# Patient Record
Sex: Male | Born: 1950 | Race: White | Hispanic: No | Marital: Married | Smoking: Never smoker
Health system: Southern US, Community
[De-identification: ages and names within clinical notes are randomized; demographics above are authoritative.]

## PROBLEM LIST (undated history)

## (undated) DIAGNOSIS — I219 Acute myocardial infarction, unspecified: Secondary | ICD-10-CM

## (undated) DIAGNOSIS — R0600 Dyspnea, unspecified: Secondary | ICD-10-CM

## (undated) DIAGNOSIS — I2510 Atherosclerotic heart disease of native coronary artery without angina pectoris: Secondary | ICD-10-CM

## (undated) DIAGNOSIS — G4730 Sleep apnea, unspecified: Secondary | ICD-10-CM

## (undated) DIAGNOSIS — I10 Essential (primary) hypertension: Secondary | ICD-10-CM

## (undated) HISTORY — PX: CARDIAC SURGERY: SHX584

## (undated) HISTORY — PX: TONSILLECTOMY: SUR1361

---

## 2006-08-27 ENCOUNTER — Ambulatory Visit: Payer: Self-pay | Admitting: Emergency Medicine

## 2006-12-17 ENCOUNTER — Ambulatory Visit: Payer: Self-pay | Admitting: Internal Medicine

## 2008-06-22 ENCOUNTER — Inpatient Hospital Stay: Payer: Self-pay | Admitting: Internal Medicine

## 2008-11-04 ENCOUNTER — Other Ambulatory Visit: Payer: Self-pay | Admitting: Internal Medicine

## 2010-02-11 ENCOUNTER — Ambulatory Visit: Payer: Self-pay | Admitting: Internal Medicine

## 2011-01-22 ENCOUNTER — Ambulatory Visit: Payer: Self-pay | Admitting: Internal Medicine

## 2011-08-19 DIAGNOSIS — I10 Essential (primary) hypertension: Secondary | ICD-10-CM

## 2011-08-19 DIAGNOSIS — E782 Mixed hyperlipidemia: Secondary | ICD-10-CM | POA: Insufficient documentation

## 2011-09-15 ENCOUNTER — Emergency Department: Payer: Self-pay | Admitting: Emergency Medicine

## 2012-01-19 ENCOUNTER — Ambulatory Visit: Payer: Self-pay | Admitting: Gastroenterology

## 2012-09-06 LAB — COMPREHENSIVE METABOLIC PANEL
Albumin: 3.4 g/dL (ref 3.4–5.0)
Alkaline Phosphatase: 82 U/L (ref 50–136)
BUN: 25 mg/dL — ABNORMAL HIGH (ref 7–18)
Bilirubin,Total: 0.3 mg/dL (ref 0.2–1.0)
Chloride: 104 mmol/L (ref 98–107)
EGFR (Non-African Amer.): >60
Glucose: 95 mg/dL (ref 65–99)
Osmolality: 278 (ref 275–301)
SGOT(AST): 28 U/L (ref 15–37)
SGPT (ALT): 38 U/L (ref 12–78)
Sodium: 137 mmol/L (ref 136–145)
Total Protein: 6.6 g/dL (ref 6.4–8.2)

## 2012-09-06 LAB — CBC
MCH: 31.9 pg (ref 26.0–34.0)
MCHC: 34.4 g/dL (ref 32.0–36.0)
MCV: 93 fL (ref 80–100)
RBC: 4.8 10*6/uL (ref 4.40–5.90)
RDW: 13.4 % (ref 11.5–14.5)

## 2012-09-07 ENCOUNTER — Inpatient Hospital Stay: Payer: Self-pay | Admitting: Internal Medicine

## 2012-09-08 LAB — BASIC METABOLIC PANEL
Anion Gap: 5 — ABNORMAL LOW (ref 7–16)
BUN: 15 mg/dL (ref 7–18)
Co2: 27 mmol/L (ref 21–32)
EGFR (African American): >60
EGFR (Non-African Amer.): >60
Glucose: 82 mg/dL (ref 65–99)
Potassium: 4.2 mmol/L (ref 3.5–5.1)
Sodium: 136 mmol/L (ref 136–145)

## 2012-09-08 LAB — CBC WITH DIFFERENTIAL/PLATELET
Basophil %: 0.6 %
Eosinophil %: 3.1 %
HCT: 42.1 % (ref 40.0–52.0)
Lymphocyte #: 1.6 10*3/uL (ref 1.0–3.6)
MCH: 32 pg (ref 26.0–34.0)
Monocyte #: 0.8 x10 3/mm (ref 0.2–1.0)
Monocyte %: 11.3 %
Neutrophil #: 4.4 10*3/uL (ref 1.4–6.5)
Neutrophil %: 62.6 %
WBC: 7.1 10*3/uL (ref 3.8–10.6)

## 2012-09-11 LAB — CULTURE, BLOOD (SINGLE)

## 2014-02-08 DIAGNOSIS — Z0189 Encounter for other specified special examinations: Secondary | ICD-10-CM | POA: Insufficient documentation

## 2014-02-08 DIAGNOSIS — IMO0001 Reserved for inherently not codable concepts without codable children: Secondary | ICD-10-CM | POA: Insufficient documentation

## 2014-05-03 NOTE — H&P (Signed)
PATIENT NAME:  Curtis Clay, Curtis Clay MR#:  [id number] DATE OF BIRTH:  [birth year]  DATE OF ADMISSION:  09/07/2012  REFERRING PHYSICIAN:  Dr. Suella BroadLinda Taylor.  PRIMARY CARE PHYSICIAN:  [facility].  PRIMARY CARDIOLOGIST:  Dr. Juliann Paresallwood.   CHIEF COMPLAINT:  Scrotal swelling and erythema.   HISTORY OF PRESENT ILLNESS:  This is a 64 year old male with significant past medical history of coronary artery disease, hypertension, hyperlipidemia who presents with above-mentioned complaints, the patient reports his complaints has been going on for a few days where he started to develop some scrotal itching and erythema and tenderness where he saw his primary care physician who prescribed him some doxycycline without much improvement, as well thereafter they did start him on some antifungal cream without much improvement which prompted to come to the ED, in the ED the patient had both CT pelvis and a scrotal ultrasound which did not show any evidence of testicular torsion or abscess, given the fact the patient has been few days on by mouth antibiotics and antifungal treatment, hospitalist service were requested to admit for IV antibiotic administration, the patient denies any fever, any chills, did not have any significant leukocytosis, the patient received IV vancomycin and Zosyn in ED after his blood cultures were done, the patient reports he had a scrotal swab done by his primary care physician with results are supposed to be tomorrow.   PAST MEDICAL HISTORY: 1.  Coronary artery disease with MI, stent placement x 3 in July 2010.  2.  Hypertension.  3.  Hyperlipidemia.  4.  DM  PAST SURGICAL HISTORY:  1.  Cardiac stent in 2010.  2.  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   SOCIAL HISTORY:  The patient lives at home.  He works in [employer] in [place].  He denies any smoking, any alcohol, any illicit drug use.   FAMILY HISTORY:  No family history of early coronary  artery disease.   ALLERGIES:  No known drug allergies.   HOME MEDICATIONS: 1.  Acetaminophen/hydrocodone 5/325 mg 1 tablet every six hours as needed.  2.  Aspirin 81 mg oral daily.  3.  Enalapril now up to 10 mg oral daily.  4.  Isosorbide mononitrate 60 mg oral daily.  5.  Doxycycline 100 mg oral 2 times a day.  6.  Simvastatin 40 mg oral daily.  7.  Plavix 75 mg oral daily.  8.  Metoprolol titrate 25 mg oral daily.  9.  Cerovele 5% topical cream apply to affected area at bedtime.  10.  Betamethasone/clotrimazole topical ointment apply to affected area 2 times a day.   REVIEW OF SYSTEMS:  CONSTITUTIONAL:  The patient denies fever, chills, fatigue, weakness, weight gain, weight loss.  EYES:  Denies blurry vision, double vision, pain, inflammation, glaucoma.  EARS, NOSE, THROAT:  Denies tinnitus, ear pain, hearing loss, epistaxis or discharge.  RESPIRATORY:  Denies cough, wheezing, hemoptysis, painful respiratory or COPD.  CARDIOVASCULAR:  Denies chest pain, orthopnea, edema, arrhythmia, palpitations, syncope.  GASTROINTESTINAL:  Denies nausea, vomiting, diarrhea, abdominal pain, hematemesis, jaundice, rectal bleed.  GENITOURINARY:  Denies dysuria, hematuria, renal colic.  ENDOCRINE:  Denies polyuria, polydipsia, heat or cold intolerance.  HEMATOLOGIC:  Denies anemia, easy bruising, bleeding diathesis.  INTEGUMENTARY:  Denies any acne, has worsening scrotal erythema and tenderness and itching over the last week, has recent resection of squamous cell cancer from the back.  MUSCULOSKELETAL:  Denies swelling, gout, arthritis or cramps.  NEUROLOGIC:  Denies CVA, TIA,  seizures, dementia, ataxia, vertigo.  PSYCHIATRIC:  Denies anxiety, insomnia, bipolar disorder or schizophrenia.   PHYSICAL EXAMINATION: VITAL SIGNS:  Temperature 98.6, pulse 61, respiratory rate 20, blood pressure 154/75, saturating 96% on room air.  GENERAL:  Elderly male who looks comfortable in bed in no apparent  distress.  HEENT:  Head atraumatic, normocephalic.  Pupils equal, reactive to light.  Pink conjunctivae.  Anicteric sclerae.  Moist oral mucosa.  NECK:  Supple.  No thyromegaly.  No JVD.  CHEST:  Good air entry bilaterally.  No wheezing, rales, rhonchi.  CARDIOVASCULAR:  S1, S2 heard.  No rubs, murmurs or gallops.  ABDOMEN:  Soft, nontender, nondistended.  Bowel sounds present.  EXTREMITIES:  No edema.  No clubbing.  No cyanosis.  PSYCHIATRIC:  Appropriate affect.  Awake, alert x 3.  Intact judgment and insight.  NEUROLOGIC:  Cranial nerves grossly intact.  Motor 5 out of 5.  No focal deficits.  GENITAL:  The patient has scrotal erythema with tenderness and with some skin denudation bilaterally.  LABORATORY DATA:  Glucose 95, BUN 25, creatinine 1.24, sodium 137, potassium 4.1, chloride 104, CO2 27, ALT 38, AST 28, alkaline phosphatase 82.  White blood cell 8.4, hemoglobin 15.3, hematocrit 44.3, platelets 140.   IMAGING STUDIES:  CT pelvis with contrast showing no soft tissue emphysema or fluid collection in the region of the pelvis.  Ultrasound testicles showing no active testicular torsion.  No testicular abscess.   ASSESSMENT AND PLAN: 1.  Testicular cellulitis, the patient has been complaining of these symptoms for the last week, did not improve on by mouth antibiotics and topical antifungal infection, so he will be admitted for IV antibiotic administration, the patient appears to be having fungal components as well, so he will be started on IV Unasyn, as well he will be started on IV Diflucan as well with nystatin powder.  We will consult urology service.  Blood cultures were already sent.  A swab was done at his primary care office pending the results.  2.  Coronary artery disease.  Denies any chest pain, any shortness of breath.  We will continue him on his home meds Plavix, aspirin, statin, lisinopril and beta-blockers.  3.  Hypertension:  Blood pressure acceptable.  We will continue home  meds.  4.  Hyperlipidemia.  Continue with statin.  5.  DVT prophylaxis.  SubQ heparin.  6.  CODE STATUS:  THE PATIENT IS FULL CODE.   Total time spent on admission and patient care 50 minutes.    ____________________________ Starleen Arms, MD dse:ea D: 09/07/2012 01:16:56 ET T: 09/07/2012 02:31:00 ET JOB#: [id number]  cc: Starleen Arms, MD, <Dictator> Twylla Arceneaux Teena Irani MD ELECTRONICALLY SIGNED 09/07/2012 5:40

## 2014-05-03 NOTE — Discharge Summary (Signed)
PATIENT NAME:  Curtis Clay, Curtis Clay MR#:  [id number] DATE OF BIRTH:  November 17, 1950  DATE OF ADMISSION:  09/07/2012 DATE OF DISCHARGE:  09/08/2012  PRIMARY CARE PHYSICIAN:  Dione HousekeeperMario Ernesto Olmedo, MD  DISCHARGE DIAGNOSES: Scrotal cellulitis, dehydration, hypertension, hyperlipidemia, coronary artery disease.   CONSULTATIONS: ID, Dr. Sampson GoonFitzgerald. Dr. Evelene CroonWolff.   CONDITION: Stable.   CODE STATUS: Full code.   HOME MEDICATIONS:  1.  Enalapril 10 mg p.o. daily. 2.  Aspirin 81 mg p.o. daily. 3.  Plavix 75 mg p.o. daily. 4.  Imdur 60 mg p.o. daily.  5.  Cerovel 40% topical cream, apply topically to affected area once a day. 6.  Lopressor 25 mg p.o. daily. 7.  Zocor 40 mg p.o. daily. 8.  Betamethasone/clotrimazole topical 0.05% to 1% topical cream, apply topically to affected area b.i.d. p.r.n. for dryness.  9.  Nystatin 100,000 units/g topical powder, apply topically to affected area 3 times a day for 10 days.  10.  Augmentin 875mg /125 mg p.o. tablets q.12 hours for 10 days.   DIET: Low-sodium, low-fat, low-cholesterol diet.   ACTIVITY: As tolerated.   FOLLOWUP CARE: Follow up with PCP within 1 to 2 weeks. Follow up with Dr. Evelene CroonWolff within 1 to 2 weeks.   REASON FOR ADMISSION: Swelling and erythema.   HOSPITAL COURSE: The patient is a 64 year old Caucasian male with a history of CAD, hypertension, hyperlipidemia, who presented to the ED with scrotal swelling and erythema. The patient took p.o. antibiotics/doxycycline for 2 days without improvement, so he came to the ED for further evaluation. The patient cannot tolerate CAT scan and ultrasound did not show any evidence of testicular torsion or abscess. The patient was treated with vancomycin and Zosyn and admitted to medical floor. For detailed history and physical examination, please refer to the admission note dictated by Dr. Randol KernElgergawy. On admission date, the patient'Clay WBC was 8.4, hemoglobin 15.3. BUN 25, creatinine 1.24, electrolytes normal. After  admission, the patient has been treated with Unasyn and Diflucan with nystatin powder. Dr. Evelene CroonWolff, urologist, evaluated the patient and suggested the patient has dermatitis. Dr. Sampson GoonFitzgerald, ID physician, evaluated the patient and suggested the patient has scrotal cellulitis, added IV Unasyn overnight and then changed to p.o. Augmentin for 10 days. The patient'Clay scrotal swelling has much improved, but still has erythema Dr. Evelene CroonWolff evaluated the patient and suggested the patient has scrotal dermatitis. The patient'Clay symptoms have much improved. He is clinically stable and will be discharged to home today. I discussed the patient'Clay discharge plan with the patient, case manager and nurse.   TIME SPENT: About 33 minutes.   ____________________________ Shaune PollackQing Tarez Bowns, MD qc:jm D: 09/08/2012 14:52:00 ET T: 09/08/2012 15:49:20 ET JOB#: [id number]  cc: Shaune PollackQing Karne Ozga, MD, <Dictator> Shaune PollackQING Loleta Frommelt MD ELECTRONICALLY SIGNED 09/09/2012 14:59

## 2014-05-03 NOTE — Consult Note (Signed)
PATIENT NAME:  Curtis Clay, Curtis Clay MR#:  [id number] DATE OF BIRTH:  [birth year]  DATE OF CONSULTATION:  09/07/2012  REFERRING PHYSICIAN:  Dr. Randol KernElgergawy.  CONSULTING PHYSICIAN:  Suszanne ConnersMichael R. Evelene CroonWolff, MD  REASON FOR CONSULTATION: Scrotal erythema.  HISTORY OF PRESENT ILLNESS: Curtis Clay is a 64 year old Caucasian male who was admitted to the hospital earlier today with a 4 to 5 day history of scrotal itching and erythema. He apparently saw a nurse practitioner who prescribed some type of topical antifungal medication. This medication appeared to cause more discomfort. He also had a wound culture, which apparently has grown out Klebsiella. He also had pelvic CT and ultrasound, which indicated no evidence of gas-forming bacterial infection or epididymal and testicular infection. The patient has had no prior similar episodes. Since hospitalization, the patient states that his symptoms have actually have improved, and the swelling in his scrotal skin has improved. He received IV vancomycin and Zosyn while hospitalized.   PAST MEDICAL HISTORY:  No drug allergies.   CHRONIC MEDICATIONS:  Include Percocet, aspirin, enalapril, isosorbide mononitrate, doxycycline, simvastatin, Plavix, metoprolol, Cerovel topical cream, betamethasone, clotrimazole ointment.   PAST AND CURRENT MEDICAL CONDITIONS:  1.  Coronary artery disease, status post MI and stent placement.  2.  Hypertension.  3.  Hyperlipidemia.  4.  Diabetes.   PREVIOUS SURGICAL PROCEDURES: Include coronary artery stent in 2010 and removal of squamous cell carcinoma of his back.     SOCIAL HISTORY: The patient denied tobacco or alcohol use.   FAMILY HISTORY: Remarkable for coronary artery disease.   REVIEW OF SYSTEMS: The patient denied dysuria or hematuria, urethral discharge, fever and chills.   PHYSICAL EXAMINATION: GENITOURINARY: Circumcised. The patient had erythematous patches on the anterior scrotal surface bilaterally, larger on the right than the  left. He had no crepitus or fluctuance. Testes were both smooth, nontender, 20 cubic centimeters in size each. Spermatic cords were both normal.   LABORATORY AND RADIOLOGIC STUDIES: Included a wound culture performed on August 25th, which indicated klebsiella sensitive to amikacin, Cipro, gentamicin, tobramycin and Septra. The organism was resistant to ampicillin.   Scrotal ultrasound dated August 27th was normal.   Abdominopelvic CT scan dated August 27th was also normal.   Pertinent laboratory studies include a white cell count of 8400 with hematocrit of 44%, and BUN of 25 with a creatinine of 1.24.   IMPRESSION: Scrotal dermatitis, possibly infectious versus allergic reaction to topical creams.   SUGGESTIONS: 1.  Scrotal support and daily bathing with warm, soapy water.  2.  Avoid topicals.  3.  Treat the culture with appropriate antibiotics.  4.  Follow up in the office after discharge.  5.  The patient is stable to be discharged from urologic standpoint. It does not appear that he has Fournier'Clay gangrene or epididymitis or orchitis at this time.         ____________________________ Suszanne ConnersMichael R. Evelene CroonWolff, MD mrw:dmm D: 09/07/2012 12:30:00 ET T: 09/07/2012 12:43:19 ET JOB#: [id number]  cc: Suszanne ConnersMichael R. Evelene CroonWolff, MD, <Dictator> Orson ApeMICHAEL R WOLFF MD ELECTRONICALLY SIGNED 09/07/2012 14:01

## 2014-05-03 NOTE — Consult Note (Signed)
Brief Consult Note: Diagnosis: Scrotal cellulitis- mild. No evidence of abscess, fourniers.  No fevers. Nml WBC. No DM.   Patient was seen by consultant.   Consult note dictated.   Recommend further assessment or treatment.   Orders entered.   Comments: Rec cont unasyn overnight If continues to improved would dc on augmentin 875 bid for 10 days with otpt fu to ensure clearance. Thank you for consult. Will follow with you.  Electronic Signatures: Dierdre HarnessFitzgerald, Elizeth Weinrich Patrick (MD)  (Signed 28-Aug-14 13:05)  Authored: Brief Consult Note   Last Updated: 28-Aug-14 13:05 by Dierdre HarnessFitzgerald, Aerie Donica Patrick (MD)

## 2014-05-03 NOTE — Consult Note (Signed)
Brief Consult Note: Diagnosis: Scrotal dermatitis.   Patient was seen by consultant.   Consult note dictated.   Recommend further assessment or treatment.   Orders entered.   Discussed with Attending MD.   Comments: Skin culture 8/25 grew out Klebsiella. Treat with appropriate ATBs as per sensitivity. Scrotal support. Shower/clean daily with warm/soapy water and dry thoroughly. No testicular/epididymal infection or Fournier's is present. Appears to be improving on antibiotics..  Electronic Signatures: Orson ApeWolff, Kaseem Vastine R (MD)  (Signed 28-Aug-14 12:44)  Authored: Brief Consult Note   Last Updated: 28-Aug-14 12:44 by Orson ApeWolff, Shaylah Mcghie R (MD)

## 2014-05-03 NOTE — Consult Note (Signed)
PATIENT NAME:  Curtis Clay, Curtis Clay MR#:  [id number] DATE OF BIRTH:  22-Jul-1950  DATE OF CONSULTATION:  09/07/2012  REFERRING PHYSICIAN: Dr. Evelene Croon.   CONSULTING PHYSICIAN:  Stann Mainland. Sampson Goon, MD  HISTORY OF PRESENT ILLNESS: This is a very pleasant 64 year old gentleman with a history of coronary artery disease, hypertension and hyperlipidemia, who presented to the ED yesterday with scrotal swelling and pain. He says his symptoms began about 1 week ago when he was the dermatologist and noted some itching and redness in his scrotum. The dermatologist told him it was likely jock itch and gave him 2 different types of creams, the names of which he does not remember. However, over the weekend, the redness and pain started to increase and he went to his primary care doctor, who gave him doxycycline without much improvement. When he returned to his primary care doctor yesterday they referred him to the ED for IV antibiotics. He says that the pain and swelling had continued, especially in his right testicle. He has had no fevers, chills or night sweats. He does not have diabetes.   In the ED, he was given a dose of vancomycin and Zosyn. He has since had some improvement overnight as he has been getting Unasyn IV. He says the swelling and redness is down somewhat. He has been seen by Dr. Evelene Croon, who does not feel it is likely a case of Fournier's and is not concerned for any degree of an abscess.   PAST MEDICAL HISTORY:  1.  Coronary artery disease with stenting in 2010.  2.  Hypertension.  3.  Hyperlipidemia.  4.  Diet controlled diabetes.   PAST SURGICAL HISTORY:  1.  Stenting.  2.  Recent squamous cell carcinoma excision from his back.   SOCIAL HISTORY: The patient lives at home. He works in [employer] in [place]. He does not smoke or drink. He has no alcohol or illicit drug use.   FAMILY HISTORY: Negative.   ALLERGIES: No known drug allergies.   ANTIBIOTICS SINCE ADMISSION INCLUDE:   1.  Vancomycin. He received 1 dose yesterday.  2.  Zosyn. He received 1 dose yesterday.  3.  Unasyn. He is currently on this and has received it overnight.   REVIEW OF SYSTEMS: 11 systems reviewed and negative except as per history of present illness.   PHYSICAL EXAMINATION:  VITAL SIGNS: T-max 98, pulse 60, blood pressure 147/81, pulse oximetry 95%.  GENERAL: He is pleasant, interactive, no acute distress.  HEENT: Pupils equal, round, reactive to light and accommodation. Extraocular movements are intact. Oropharynx is clear.  HEART: Regular.  LUNGS: Clear.  ABDOMEN: Soft, nontender, nondistended. No hepatosplenomegaly. GENITOURINARY: His scrotum has some anterior and distal erythema. This has been demarcated by Dr. Sheppard Penton. It is mildly tender to palpation. There is no evidence of drainage or an abscess. There is no spreading redness or streaking. He has no inguinal lymphadenopathy.   LABORATORY, DIAGNOSTIC AND RADIOLOGICAL DATA: White blood count on admission is 8.4, hemoglobin 15.3, platelets 140. BUN 25, creatinine 1.24. LFTs normal. Ultrasound of the testicle was negative for testicular torsion or abscess. CT of his pelvis revealed no soft tissue emphysema or free collection in the region of the pelvis. Culture from outpatient records done on 08/25 from the left groin reveals no organisms and no white blood cells on Gram stain but growing 1+  Klebsiella oxytoca sensitive to ampicillin, ciprofloxacin, gentamicin, tobramycin and Bactrim, but resistant to ampicillin. There is no sensitivity noted for Augmentin or  Unasyn.   IMPRESSION: A 64 year old with coronary artery disease, hypertension, admitted with increasing pain and redness in his scrotum. This likely began as a tinea cruris infection that has become superinfected. He has no evidence of abscess. He has normal white count and is not febrile. Blood cultures are pending, but are negative. Culture from the outside shows Klebsiella oxytoca.    This is likely a polymicrobial infection. I would not base treatment solely on the Klebsiella oxytoca. He clinically is responding, although he had received both vancomycin, Zosyn and Unasyn since admission. At this point, I would recommend continuing him on Unasyn overnight. If this continues to improve, I would recommend we could discharge him on Augmentin 875 twice a day as an outpatient for 10 days with appropriate followup.   Thank you for the consult. I will be glad to follow with you.  ____________________________ Stann Mainlandavid P. Sampson GoonFitzgerald, MD dpf:aw D: 09/07/2012 13:04:26 ET T: 09/07/2012 13:17:36 ET JOB#: [id number]  cc: Stann Mainlandavid P. Sampson GoonFitzgerald, MD, <Dictator> Billy Turvey Sampson GoonFITZGERALD MD ELECTRONICALLY SIGNED 09/11/2012 11:51

## 2014-11-12 IMAGING — CT CT PELVIS W/ CM
1 series · 16 of 32 positions shown, 20 images · non-contrast
Comparison: None

REASON FOR EXAM: swelling to testicles, rule out Mayk Feng
gangrene;    NOTE: Nursing to G
COMMENTS:

PROCEDURE:     CT  - CT PELVIS STANDARD W  - September 06, 2012 [DATE]
RESULT:     History: Soft tissue swelling in the peroneal region
TECHNIQUE: Multiple axial images obtained of the pelvis with sagittal and
coronal reformatted images provided following 100 mL of Vsovue-DH4
intravenous contrast.

[Series 2: pelvis · axial · 0.78mm/px · z∈[+62,+374]mm · 16 of 116 slices shown, 20 images]
[im 8/116  soft-tissue]
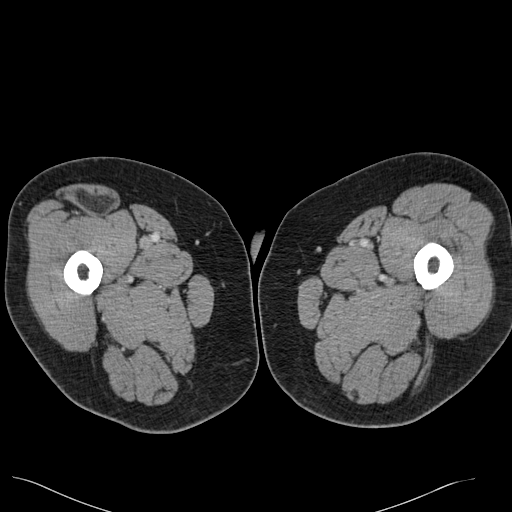
[im 8/116  bone]
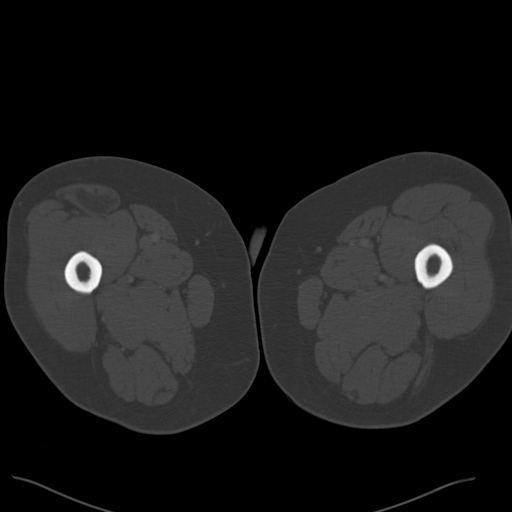
[im 15/116  soft-tissue]
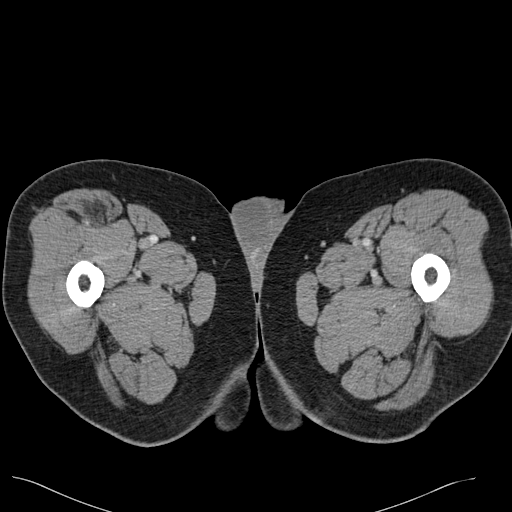
[im 23/116  soft-tissue]
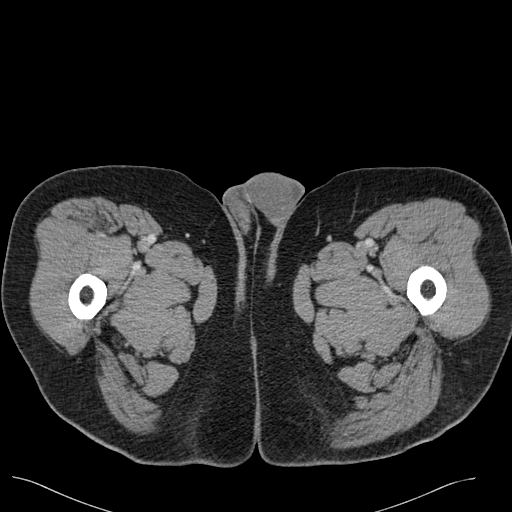
[im 30/116  soft-tissue]
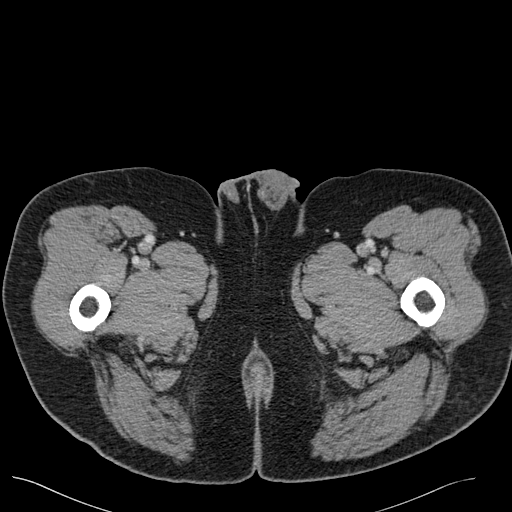
[im 38/116  soft-tissue]
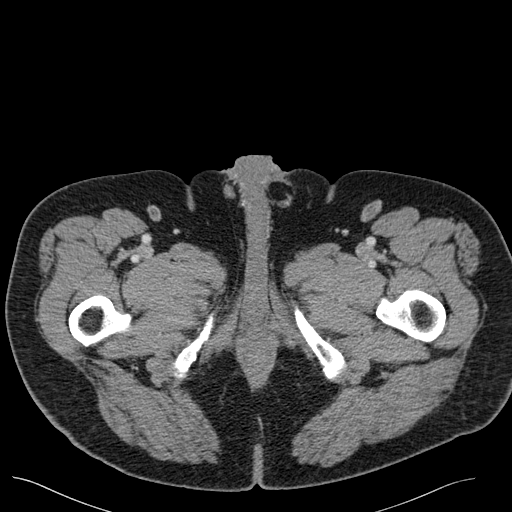
[im 45/116  soft-tissue]
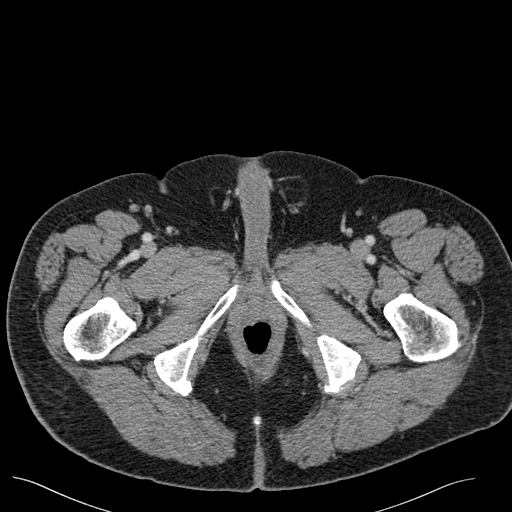
[im 52/116  soft-tissue]
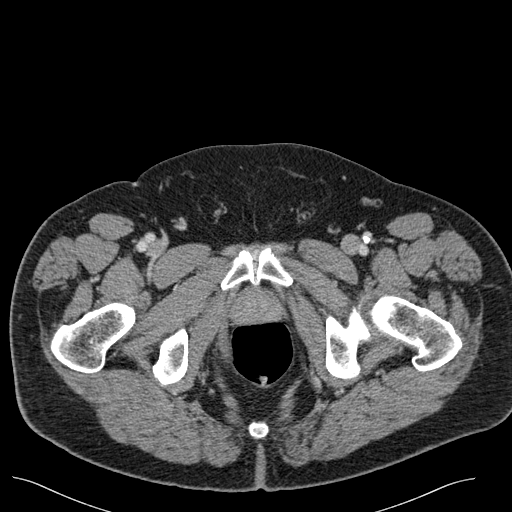
[im 64/116  soft-tissue]
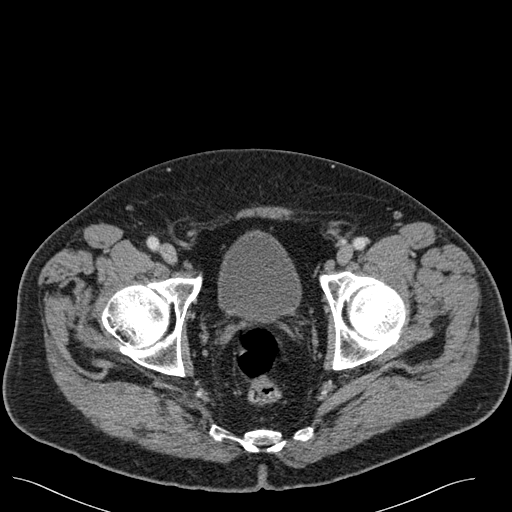
[im 71/116  soft-tissue]
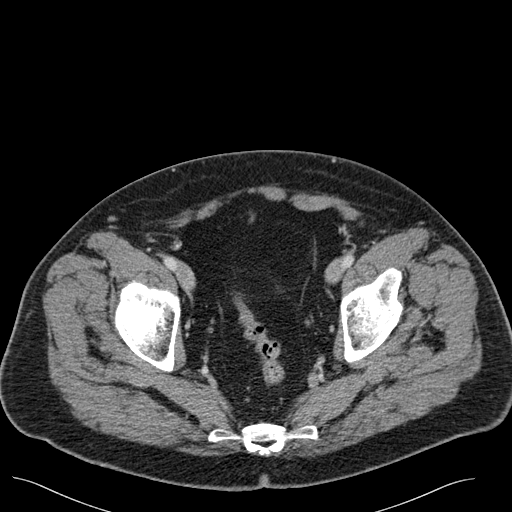
[im 71/116  bone]
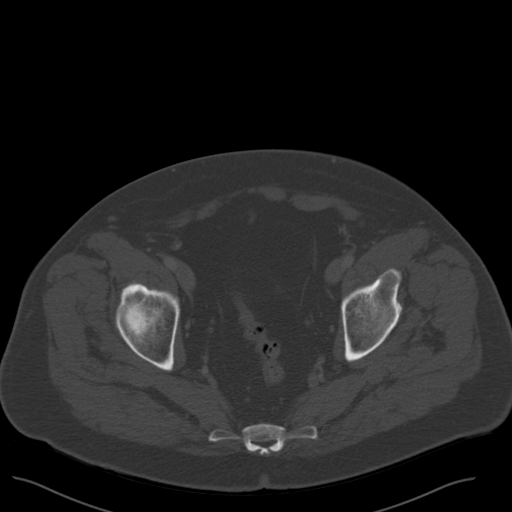
[im 78/116  soft-tissue]
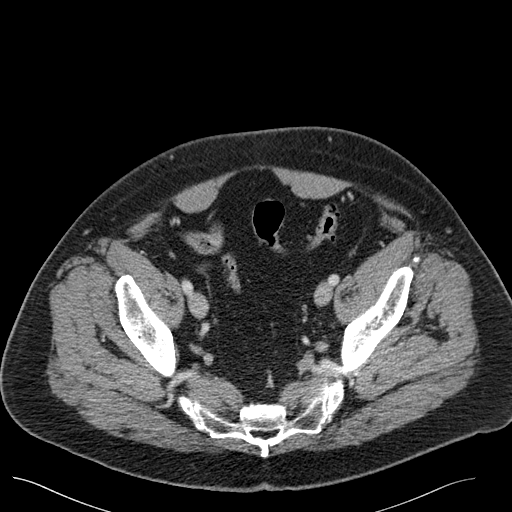
[im 86/116  soft-tissue]
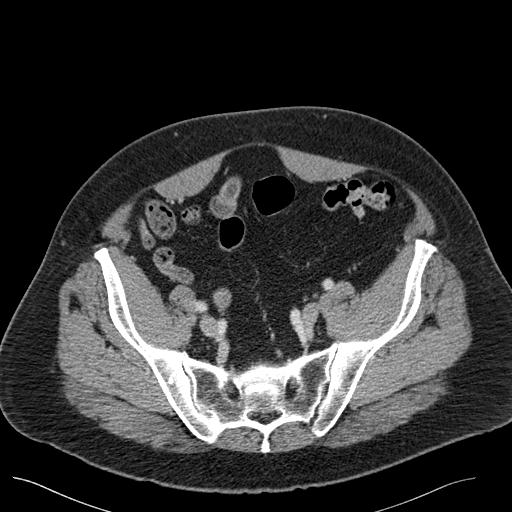
[im 93/116  soft-tissue]
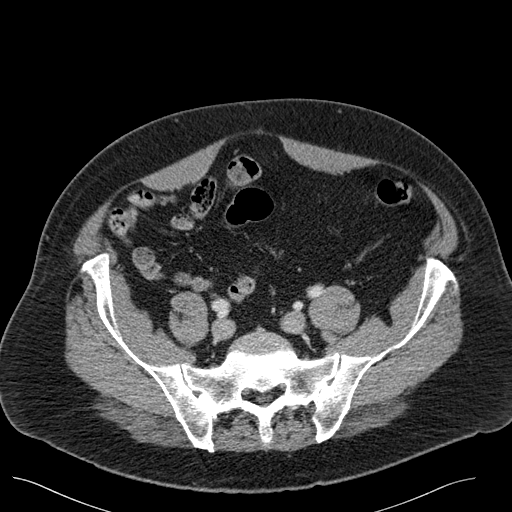
[im 101/116  soft-tissue]
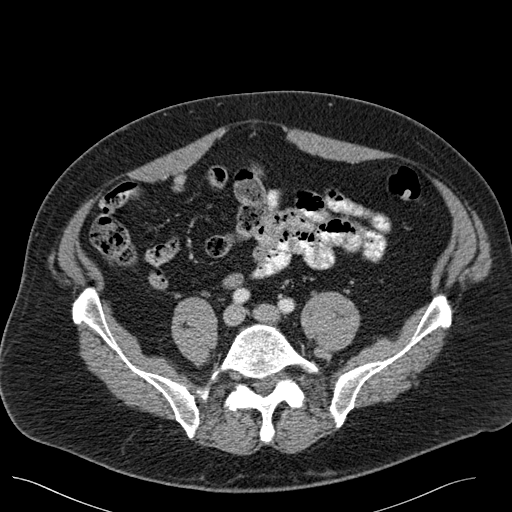
[im 101/116  lung]
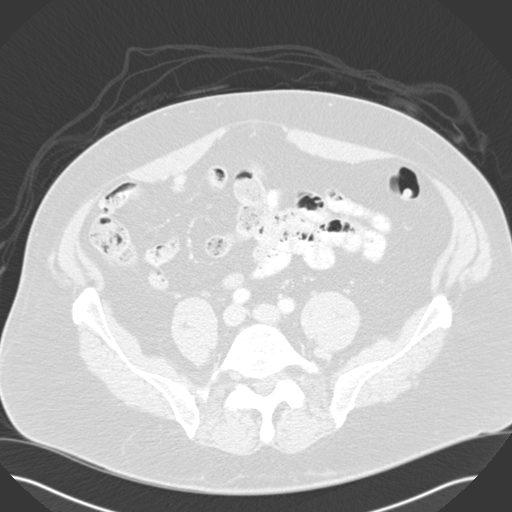
[im 104/116  lung]
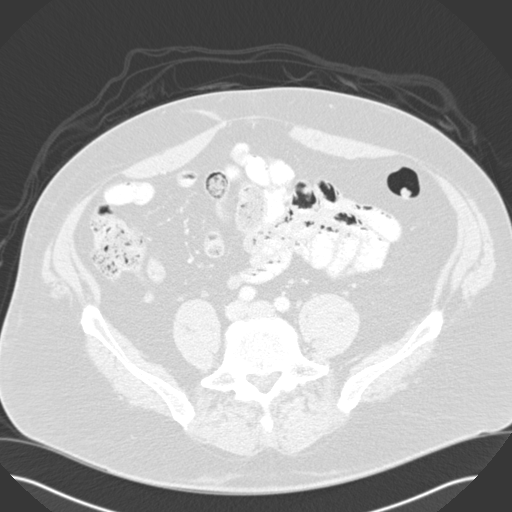
[im 108/116  soft-tissue]
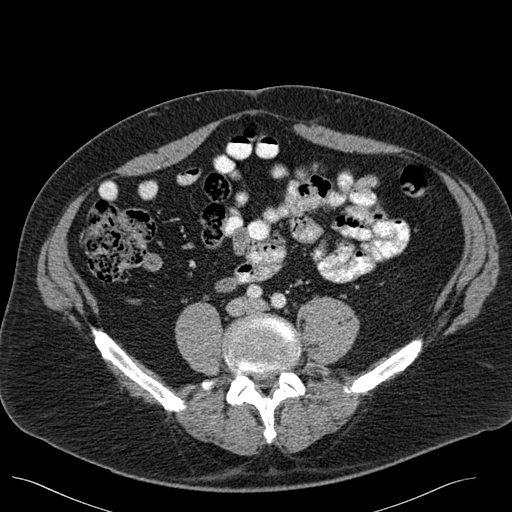
[im 108/116  lung]
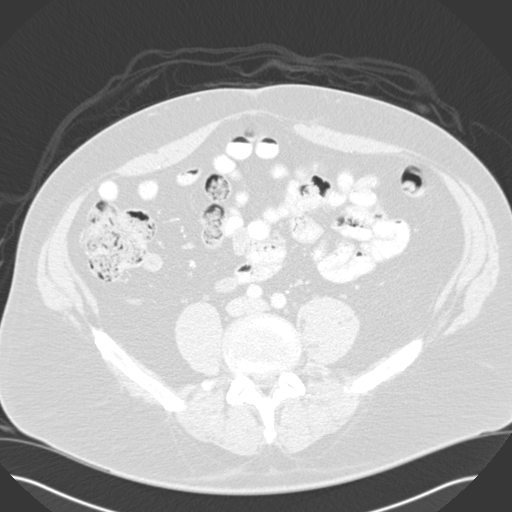
[im 112/116  lung]
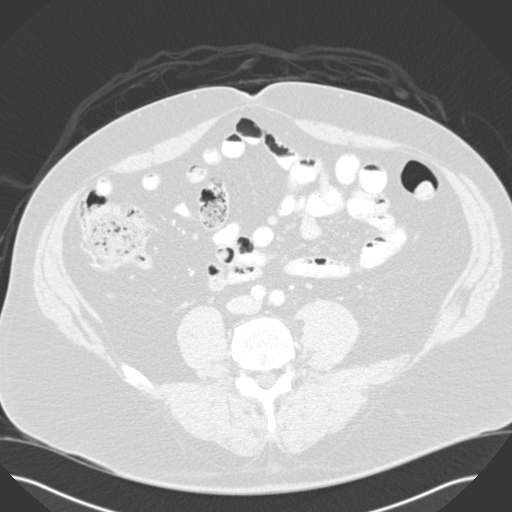

[16 of 32 positions shown; findings below may reference images not displayed]

FINDINGS: There is no soft tissue emphysema. There is no soft tissue fluid collection.

There is no pelvic free fluid. The visualized bowel is nondistended without
bowel wall thickening.

The osseous structures are unremarkable.

There is fatty atrophy of the right rectus femoris muscle.
IMPRESSION: No soft tissue emphysema or fluid collection in the region of the pelvis.

## 2016-07-28 ENCOUNTER — Ambulatory Visit
Admission: EM | Admit: 2016-07-28 | Discharge: 2016-07-28 | Disposition: A | Discharge status: Home / Self Care | Payer: Medicare Other | Attending: Family Medicine | Admitting: Family Medicine

## 2016-07-28 ENCOUNTER — Encounter: Payer: Self-pay | Admitting: Emergency Medicine

## 2016-07-28 DIAGNOSIS — M545 Low back pain, unspecified: Secondary | ICD-10-CM

## 2016-07-28 HISTORY — DX: Acute myocardial infarction, unspecified: I21.9

## 2016-07-28 HISTORY — DX: Essential (primary) hypertension: I10

## 2016-07-28 MED ORDER — CYCLOBENZAPRINE HCL 10 MG PO TABS: ORAL_TABLET | ORAL | 0 refills | Status: DC

## 2016-07-28 NOTE — ED Provider Notes (Signed)
[facility] ____________________________________________  Time seen: Approximately 11:46 AM  I have reviewed the triage vital signs and the nursing notes.   HISTORY  Chief Complaint Back Pain   HPI Curtis Clay is a 66 y.o. male presenting for evaluation of low back pain that is been present gradual in onset over the last week. Patient reports last week while he was "tinkering" around the house he felt a slight "twinge" in his low back but states it didn't really bother him. States over the next few days he noticed he had a very mild low back pain that had continued. Patient reports this morning when getting up his pain had increased. States if sitting still his pain is maybe a 1-1.5, but states when he actively moves such as sitting up for bed or twisting his back his pain jumps up to moderate level. Patient states the moderate level pain only lasts for a few seconds and then comes back down. Patient reports movement aggravates the pain. Reports diffuse sitting still minimal pain. Reports minimal pain with walking. Patient denies any fall, direct trauma, or known injury. Reports he has a history of similar back pain many years ago with muscle spasms that feels exactly the same. States the pain is a catching pain that lasts for a few seconds and then releases. States pain stays in his low back. Denies any pain radiation, paresthesias, urinary or bowel retention or incontinence, abdominal pain, chest pain, shortness of breath, dizziness, weakness, rash, skin changes, numbness, decreased range of motion. Patient has not taken any over-the-counter medications for the same complaints. Reports otherwise feels well.  Denies chest pain, shortness of breath, abdominal pain, dysuria, extremity pain, extremity swelling or rash. Denies recent sickness. Denies recent antibiotic use.   Dione Housekeeperlmedo, Mario Ernesto, MD: PCP   Past Medical History:  Diagnosis Date  . Hypertension   . Myocardial  infarction [facility](HCC)   CAD  Angina Pectoris Hyperlipidemia   There are no active problems to display for this patient.   Past Surgical History:  Procedure Laterality Date  . CARDIAC SURGERY       No current facility-administered medications for this encounter.   Current Outpatient Prescriptions:  .  aspirin EC 81 MG tablet, Take 81 mg by mouth daily., Disp: , Rfl:  .  ENALAPRIL MALEATE PO, Take 50 mg by mouth daily., Disp: , Rfl:  .  metoprolol succinate (TOPROL-XL) 25 MG 24 hr tablet, Take 25 mg by mouth daily., Disp: , Rfl:  .  simvastatin (ZOCOR) 40 MG tablet, Take 40 mg by mouth daily., Disp: , Rfl:  .  cyclobenzaprine (FLEXERIL) 10 MG tablet, Take 0.5 tablet orally during the day, one tablet at night as needed for muscle spasm. Do not drive while taking as can cause drowsiness., Disp: 20 tablet, Rfl: 0  Allergies Patient has no known allergies.  History reviewed. No pertinent family history.  Social History Social History  Substance Use Topics  . Smoking status: Never Smoker  . Smokeless tobacco: Never Used  . Alcohol use No    Review of Systems Constitutional: No fever/chills Eyes: No visual changes. Cardiovascular: Denies chest pain. Respiratory: Denies shortness of breath. Gastrointestinal: No abdominal pain.  No nausea, no vomiting.  No diarrhea.  No constipation. Genitourinary: Negative for dysuria. Musculoskeletal: Positive for back pain. Skin: Negative for rash. Neurological: Negative for headaches, focal weakness or numbness.   ____________________________________________   PHYSICAL EXAM:  VITAL SIGNS: ED Triage Vitals  Enc Vitals Group  BP 07/28/16 1129 (!) 142/66     Pulse Rate 07/28/16 1129 60     Resp 07/28/16 1129 16     Temp 07/28/16 1129 98.3 F (36.8 C)     Temp Source 07/28/16 1129 Oral     SpO2 07/28/16 1129 98 %     Weight 07/28/16 1125 249 lb (112.9 kg)     Height 07/28/16 1125 5' 9.5" (1.765 m)     Head Circumference --       Peak Flow --      Pain Score 07/28/16 1125 7     Pain Loc --      Pain Edu? --      Excl. in GC? --     Constitutional: Alert and oriented. Well appearing and in no acute distress. ENT      Head: Normocephalic and atraumatic.      Mouth/Throat: Mucous membranes are moist. Cardiovascular: Normal rate, regular rhythm. Grossly normal heart sounds.  Good peripheral circulation. Respiratory: Normal respiratory effort without tachypnea nor retractions. Breath sounds are clear and equal bilaterally. No wheezes, rales, rhonchi. Gastrointestinal: Soft and nontender. Obese abdomen. No CVA tenderness. Musculoskeletal:  Nontender with normal range of motion in all extremities. No midline cervical or thoracic tenderness to palpation. Bilateral pedal pulses equal and easily palpated.      Right lower leg:  No tenderness or edema.      Left lower leg:  No tenderness or edema.  Except: Minimal to mild diffuse low back pain along lower midline lumbar and paralumbar musculature, no distinct midline tenderness to palpation, no erythema, no ecchymosis, no skin changes noted, changes positions quickly, ambulatory with steady gait, no pain with bilateral standing knee lifts, bilateral plantar flexion and dorsiflexion strong and equal, no saddle anesthesia, bilateral lower extremities nontender, pain increases with right and left lumbar rotation as well as lumbar flexion and extension, but full range of motion present.  Neurologic:  Normal speech and language. No gross focal neurologic deficits are appreciated. Speech is normal. No gait instability.  Skin:  Skin is warm, dry and intact. No rash noted. Psychiatric: Mood and affect are normal. Speech and behavior are normal. Patient exhibits appropriate insight and judgment   ___________________________________________   LABS (all labs ordered are listed, but only abnormal results are displayed)  Labs Reviewed - No data to display   PROCEDURES Procedures     INITIAL IMPRESSION / ASSESSMENT AND PLAN / ED COURSE  Pertinent labs & imaging results that were available during my care of the patient were reviewed by me and considered in my medical decision making (see chart for details).  Very well-appearing patient. No acute distress. Presents for evaluation of low back pain is primarily with movement. Denies fall or direct trauma. Reports history of similar pain in the past with muscle strain injury. No focal neurological deficits. No point bony tenderness. Discussed patient with patient evaluation of x-ray at this time versus conservative management with medications, patient declines imaging at this time. Will treat patient with Flexeril as needed, half tablet during the day and one tablet at night. As patient does have extensive cardiac history, also discussed patient to take over-the-counter Tylenol as needed. Encourage ice heat rotation, stretching and points of strenuous activity. Discussed follow up with PCP or orthopedics in 1 week as needed for continued pain. Discussed return sooner for any worsening complaints, fever, urinary or bowel changes, difficulty ambulating, new or worsening concerns.Discussed indication, risks and benefits of medications with patient.  Discussed follow up with Primary care physician this week. Discussed follow up and return parameters including no resolution or any worsening concerns. Patient verbalized understanding and agreed to plan.   ____________________________________________   FINAL CLINICAL IMPRESSION(S) / ED DIAGNOSES  Final diagnoses:  Acute bilateral low back pain without sciatica     Discharge Medication List as of 07/28/2016 12:20 PM    START taking these medications   Details  cyclobenzaprine (FLEXERIL) 10 MG tablet Take 0.5 tablet orally during the day, one tablet at night as needed for muscle spasm. Do not drive while taking as can cause drowsiness., Normal        Note: This dictation was  prepared with Dragon dictation along with smaller phrase technology. Any transcriptional errors that result from this process are unintentional.         Renford Dills, NP 07/28/16 1248

## 2016-07-28 NOTE — Discharge Instructions (Signed)
Take medication as prescribed. Rest. Drink plenty of fluids. Stretch. Alternate heat and ice. Take over the counter tylenol as needed.   Follow up with your primary care physician this week as needed. Return to Urgent care for new or worsening concerns.

## 2016-07-28 NOTE — ED Triage Notes (Signed)
Patient c/o lower back pain for a week.  Patient reports increase in pain when he bends a certain way.

## 2016-08-20 ENCOUNTER — Inpatient Hospital Stay
Admit: 2016-08-20 | Discharge: 2016-08-20 | Disposition: A | Discharge status: Home / Self Care | Payer: Medicare Other | Attending: Internal Medicine | Admitting: Internal Medicine

## 2016-08-20 ENCOUNTER — Emergency Department: Payer: Medicare Other

## 2016-08-20 ENCOUNTER — Inpatient Hospital Stay
Admission: EM | Admit: 2016-08-20 | Discharge: 2016-08-21 | DRG: 247 | Disposition: A | Discharge status: Home / Self Care | Payer: Medicare Other | Attending: Internal Medicine | Admitting: Internal Medicine

## 2016-08-20 ENCOUNTER — Inpatient Hospital Stay: Admit: 2016-08-20 | Payer: Medicare Other

## 2016-08-20 ENCOUNTER — Other Ambulatory Visit: Payer: Self-pay

## 2016-08-20 ENCOUNTER — Encounter
Admission: EM | Disposition: A | Discharge status: Home / Self Care | Payer: Self-pay | Source: Home / Self Care | Attending: Internal Medicine

## 2016-08-20 ENCOUNTER — Encounter: Payer: Self-pay | Admitting: Emergency Medicine

## 2016-08-20 DIAGNOSIS — Z79899 Other long term (current) drug therapy: Secondary | ICD-10-CM | POA: Diagnosis not present

## 2016-08-20 DIAGNOSIS — I10 Essential (primary) hypertension: Secondary | ICD-10-CM | POA: Diagnosis not present

## 2016-08-20 DIAGNOSIS — Z9861 Coronary angioplasty status: Secondary | ICD-10-CM | POA: Diagnosis not present

## 2016-08-20 DIAGNOSIS — I25110 Atherosclerotic heart disease of native coronary artery with unstable angina pectoris: Secondary | ICD-10-CM | POA: Diagnosis present

## 2016-08-20 DIAGNOSIS — I209 Angina pectoris, unspecified: Secondary | ICD-10-CM

## 2016-08-20 DIAGNOSIS — I252 Old myocardial infarction: Secondary | ICD-10-CM | POA: Insufficient documentation

## 2016-08-20 DIAGNOSIS — I214 Non-ST elevation (NSTEMI) myocardial infarction: Secondary | ICD-10-CM | POA: Diagnosis present

## 2016-08-20 DIAGNOSIS — I2510 Atherosclerotic heart disease of native coronary artery without angina pectoris: Secondary | ICD-10-CM | POA: Diagnosis not present

## 2016-08-20 DIAGNOSIS — Z7982 Long term (current) use of aspirin: Secondary | ICD-10-CM

## 2016-08-20 HISTORY — PX: LEFT HEART CATH AND CORONARY ANGIOGRAPHY: CATH118249

## 2016-08-20 HISTORY — PX: CORONARY STENT INTERVENTION: CATH118234

## 2016-08-20 LAB — BASIC METABOLIC PANEL
ANION GAP: 7 (ref 5–15)
BUN: 14 mg/dL (ref 6–20)
CHLORIDE: 107 mmol/L (ref 101–111)
CO2: 24 mmol/L (ref 22–32)
Calcium: 9.1 mg/dL (ref 8.9–10.3)
Creatinine, Ser: 0.99 mg/dL (ref 0.61–1.24)
GFR calc Af Amer: >60 mL/min (ref >60)
GFR calc non Af Amer: >60 mL/min (ref >60)
GLUCOSE: 110 mg/dL — AB (ref 65–99)
POTASSIUM: 4.3 mmol/L (ref 3.5–5.1)
Sodium: 138 mmol/L (ref 135–145)

## 2016-08-20 LAB — CBC
HEMATOCRIT: 46.5 % (ref 40.0–52.0)
HEMOGLOBIN: 15.8 g/dL (ref 13.0–18.0)
MCH: 31.5 pg (ref 26.0–34.0)
MCHC: 34.1 g/dL (ref 32.0–36.0)
MCV: 92.3 fL (ref 80.0–100.0)
Platelets: 141 10*3/uL — ABNORMAL LOW (ref 150–440)
RBC: 5.03 MIL/uL (ref 4.40–5.90)
RDW: 13.5 % (ref 11.5–14.5)
WBC: 7.7 10*3/uL (ref 3.8–10.6)

## 2016-08-20 LAB — PROTIME-INR
INR: 1
Prothrombin Time: 13.2 seconds (ref 11.4–15.2)

## 2016-08-20 LAB — APTT: aPTT: 28 seconds (ref 24–36)

## 2016-08-20 LAB — TROPONIN I
TROPONIN I: 1.1 ng/mL — AB (ref <0.03)
Troponin I: 0.14 ng/mL (ref <0.03)
Troponin I: 0.24 ng/mL (ref <0.03)

## 2016-08-20 LAB — HEMOGLOBIN A1C
HEMOGLOBIN A1C: 5.7 % — AB (ref 4.8–5.6)
MEAN PLASMA GLUCOSE: 116.89 mg/dL

## 2016-08-20 LAB — LIPID PANEL
CHOL/HDL RATIO: 3.4 ratio
CHOLESTEROL: 122 mg/dL (ref 0–200)
HDL: 36 mg/dL — AB (ref >40)
LDL Cholesterol: 80 mg/dL (ref 0–99)
Triglycerides: 32 mg/dL (ref <150)
VLDL: 6 mg/dL (ref 0–40)

## 2016-08-20 LAB — POCT ACTIVATED CLOTTING TIME: Activated Clotting Time: 577 seconds

## 2016-08-20 LAB — GLUCOSE, CAPILLARY: GLUCOSE-CAPILLARY: 104 mg/dL — AB (ref 65–99)

## 2016-08-20 SURGERY — LEFT HEART CATH AND CORONARY ANGIOGRAPHY
Anesthesia: Moderate Sedation

## 2016-08-20 MED ORDER — DOCUSATE SODIUM 100 MG PO CAPS: 100.0000 mg | ORAL_CAPSULE | Freq: Two times a day (BID) | ORAL | Status: DC | PRN

## 2016-08-20 MED ORDER — FENTANYL CITRATE (PF) 100 MCG/2ML IJ SOLN
INTRAMUSCULAR | Status: DC | PRN
  Administered 2016-08-20: 25 ug via INTRAVENOUS

## 2016-08-20 MED ORDER — MIDAZOLAM HCL 2 MG/2ML IJ SOLN
INTRAMUSCULAR | Status: DC | PRN
  Administered 2016-08-20: 1 mg via INTRAVENOUS

## 2016-08-20 MED ORDER — ASPIRIN 81 MG PO CHEW
324.0000 mg | CHEWABLE_TABLET | Freq: Once | ORAL | Status: AC
  Administered 2016-08-20: 324 mg via ORAL
  Filled 2016-08-20: qty 4

## 2016-08-20 MED ORDER — SODIUM CHLORIDE 0.9% FLUSH: 3.0000 mL | INTRAVENOUS | Status: DC | PRN

## 2016-08-20 MED ORDER — NITROGLYCERIN 1 MG/10 ML FOR IR/CATH LAB
INTRA_ARTERIAL | Status: DC | PRN
  Administered 2016-08-20: 200 ug via INTRACORONARY

## 2016-08-20 MED ORDER — ACETAMINOPHEN 325 MG PO TABS: 650.0000 mg | ORAL_TABLET | ORAL | Status: DC | PRN

## 2016-08-20 MED ORDER — CLOPIDOGREL BISULFATE 75 MG PO TABS
ORAL_TABLET | ORAL | Status: DC | PRN
  Administered 2016-08-20: 600 mg via ORAL

## 2016-08-20 MED ORDER — SODIUM CHLORIDE 0.9 % IV SOLN
INTRAVENOUS | Status: AC | PRN
  Administered 2016-08-20: 1.75 mg/kg/h via INTRAVENOUS

## 2016-08-20 MED ORDER — SODIUM CHLORIDE 0.9% FLUSH: 3.0000 mL | Freq: Two times a day (BID) | INTRAVENOUS | Status: DC

## 2016-08-20 MED ORDER — ENALAPRIL MALEATE 5 MG PO TABS
5.0000 mg | ORAL_TABLET | Freq: Every day | ORAL | Status: DC
  Administered 2016-08-20 – 2016-08-21 (×2): 5 mg via ORAL
  Filled 2016-08-20 (×2): qty 1

## 2016-08-20 MED ORDER — LABETALOL HCL 5 MG/ML IV SOLN: 10.0000 mg | INTRAVENOUS | Status: AC | PRN

## 2016-08-20 MED ORDER — DIPHENHYDRAMINE HCL 25 MG PO CAPS
25.0000 mg | ORAL_CAPSULE | Freq: Four times a day (QID) | ORAL | Status: DC | PRN
  Administered 2016-08-20: 25 mg via ORAL
  Filled 2016-08-20 (×2): qty 1

## 2016-08-20 MED ORDER — MIDAZOLAM HCL 2 MG/2ML IJ SOLN
INTRAMUSCULAR | Status: AC
  Filled 2016-08-20: qty 2

## 2016-08-20 MED ORDER — HEPARIN (PORCINE) IN NACL 100-0.45 UNIT/ML-% IJ SOLN
1250.0000 [IU]/h | INTRAMUSCULAR | Status: DC
  Administered 2016-08-20: 1250 [IU]/h via INTRAVENOUS
  Filled 2016-08-20: qty 250

## 2016-08-20 MED ORDER — CLOPIDOGREL BISULFATE 75 MG PO TABS
75.0000 mg | ORAL_TABLET | Freq: Every day | ORAL | Status: DC
  Administered 2016-08-21: 75 mg via ORAL
  Filled 2016-08-20: qty 1

## 2016-08-20 MED ORDER — ASPIRIN 81 MG PO CHEW: 81.0000 mg | CHEWABLE_TABLET | ORAL | Status: DC

## 2016-08-20 MED ORDER — ASPIRIN 81 MG PO CHEW: 81.0000 mg | CHEWABLE_TABLET | Freq: Every day | ORAL | Status: DC

## 2016-08-20 MED ORDER — SODIUM CHLORIDE 0.9 % IV SOLN
0.2500 mg/kg/h | INTRAVENOUS | Status: DC
  Filled 2016-08-20: qty 250

## 2016-08-20 MED ORDER — IOPAMIDOL (ISOVUE-300) INJECTION 61%: INTRAVENOUS | Status: DC | PRN

## 2016-08-20 MED ORDER — HYDRALAZINE HCL 20 MG/ML IJ SOLN
INTRAMUSCULAR | Status: AC
  Filled 2016-08-20: qty 1

## 2016-08-20 MED ORDER — CLOPIDOGREL BISULFATE 300 MG PO TABS
ORAL_TABLET | ORAL | Status: AC
  Filled 2016-08-20: qty 2

## 2016-08-20 MED ORDER — SODIUM CHLORIDE 0.9 % IV SOLN: 250.0000 mL | INTRAVENOUS | Status: DC | PRN

## 2016-08-20 MED ORDER — IOPAMIDOL (ISOVUE-300) INJECTION 61%
INTRAVENOUS | Status: DC | PRN
  Administered 2016-08-20: 60 mL via INTRA_ARTERIAL

## 2016-08-20 MED ORDER — SODIUM CHLORIDE 0.9 % WEIGHT BASED INFUSION: 1.0000 mL/kg/h | INTRAVENOUS | Status: DC

## 2016-08-20 MED ORDER — NITROGLYCERIN 0.4 MG SL SUBL
0.4000 mg | SUBLINGUAL_TABLET | Freq: Once | SUBLINGUAL | Status: AC
  Administered 2016-08-20: 0.4 mg via SUBLINGUAL
  Filled 2016-08-20: qty 1

## 2016-08-20 MED ORDER — ATORVASTATIN CALCIUM 10 MG PO TABS
10.0000 mg | ORAL_TABLET | Freq: Every day | ORAL | Status: DC
  Administered 2016-08-20: 10 mg via ORAL
  Filled 2016-08-20: qty 1

## 2016-08-20 MED ORDER — ONDANSETRON HCL 4 MG/2ML IJ SOLN: 4.0000 mg | Freq: Four times a day (QID) | INTRAMUSCULAR | Status: DC | PRN

## 2016-08-20 MED ORDER — SODIUM CHLORIDE 0.9 % WEIGHT BASED INFUSION: 1.0000 mL/kg/h | INTRAVENOUS | Status: AC

## 2016-08-20 MED ORDER — SODIUM CHLORIDE 0.9 % WEIGHT BASED INFUSION: 3.0000 mL/kg/h | INTRAVENOUS | Status: DC

## 2016-08-20 MED ORDER — BIVALIRUDIN TRIFLUOROACETATE 250 MG IV SOLR
INTRAVENOUS | Status: AC
  Filled 2016-08-20: qty 250

## 2016-08-20 MED ORDER — HYDRALAZINE HCL 20 MG/ML IJ SOLN
INTRAMUSCULAR | Status: DC | PRN
  Administered 2016-08-20: 10 mg via INTRAVENOUS

## 2016-08-20 MED ORDER — FENTANYL CITRATE (PF) 100 MCG/2ML IJ SOLN
INTRAMUSCULAR | Status: AC
  Filled 2016-08-20: qty 2

## 2016-08-20 MED ORDER — ASPIRIN EC 81 MG PO TBEC
81.0000 mg | DELAYED_RELEASE_TABLET | Freq: Every day | ORAL | Status: DC
  Administered 2016-08-21: 81 mg via ORAL
  Filled 2016-08-20: qty 1

## 2016-08-20 MED ORDER — HYDRALAZINE HCL 20 MG/ML IJ SOLN: 5.0000 mg | INTRAMUSCULAR | Status: AC | PRN

## 2016-08-20 MED ORDER — PERFLUTREN LIPID MICROSPHERE
1.0000 mL | INTRAVENOUS | Status: AC | PRN
  Administered 2016-08-20: 1.5 mL via INTRAVENOUS
  Filled 2016-08-20: qty 10

## 2016-08-20 MED ORDER — HEPARIN BOLUS VIA INFUSION
4000.0000 [IU] | Freq: Once | INTRAVENOUS | Status: AC
  Administered 2016-08-20: 4000 [IU] via INTRAVENOUS
  Filled 2016-08-20: qty 4000

## 2016-08-20 MED ORDER — NITROGLYCERIN 5 MG/ML IV SOLN
INTRAVENOUS | Status: AC
Start: 2016-08-20 — End: 2016-08-20
  Filled 2016-08-20: qty 10

## 2016-08-20 MED ORDER — NITROGLYCERIN 0.4 MG SL SUBL: 0.4000 mg | SUBLINGUAL_TABLET | SUBLINGUAL | Status: DC | PRN

## 2016-08-20 MED ORDER — METOPROLOL TARTRATE 25 MG PO TABS
25.0000 mg | ORAL_TABLET | Freq: Every day | ORAL | Status: DC
  Administered 2016-08-20 – 2016-08-21 (×2): 25 mg via ORAL
  Filled 2016-08-20 (×2): qty 1

## 2016-08-20 MED ORDER — HEPARIN (PORCINE) IN NACL 2-0.9 UNIT/ML-% IJ SOLN
INTRAMUSCULAR | Status: AC
  Filled 2016-08-20: qty 500

## 2016-08-20 MED ORDER — BIVALIRUDIN BOLUS VIA INFUSION - CUPID
INTRAVENOUS | Status: DC | PRN
  Administered 2016-08-20: 84.675 mg via INTRAVENOUS

## 2016-08-20 MED ORDER — IOPAMIDOL (ISOVUE-300) INJECTION 61%
INTRAVENOUS | Status: DC | PRN
  Administered 2016-08-20: 410 mL via INTRA_ARTERIAL

## 2016-08-20 MED ORDER — SIMVASTATIN 20 MG PO TABS
40.0000 mg | ORAL_TABLET | Freq: Every day | ORAL | Status: DC
  Administered 2016-08-20: 40 mg via ORAL
  Filled 2016-08-20: qty 2

## 2016-08-20 SURGICAL SUPPLY — 22 items
BALLN TREK RX 2.75X15 (BALLOONS) ×3
BALLN ~~LOC~~ TREK RX 3.0X15 (BALLOONS) ×3
BALLN ~~LOC~~ TREK RX 3.25X12 (BALLOONS) ×3
BALLOON TREK RX 2.75X15 (BALLOONS) ×1 IMPLANT
BALLOON ~~LOC~~ TREK RX 3.0X15 (BALLOONS) ×1 IMPLANT
BALLOON ~~LOC~~ TREK RX 3.25X12 (BALLOONS) ×1 IMPLANT
CATH 5FR JL4 DIAGNOSTIC (CATHETERS) ×3 IMPLANT
CATH INFINITI 5FR ANG PIGTAIL (CATHETERS) ×3 IMPLANT
CATH INFINITI JR4 5F (CATHETERS) ×3 IMPLANT
CATH LAUNCHER 6FR EBU3.5 (CATHETERS) ×3 IMPLANT
DEVICE CLOSURE MYNXGRIP 6/7F (Vascular Products) ×3 IMPLANT
DEVICE INFLAT 30 PLUS (MISCELLANEOUS) ×3 IMPLANT
GUIDEWIRE 3MM J TIP .035 145 (WIRE) ×6 IMPLANT
KIT MANI 3VAL PERCEP (MISCELLANEOUS) ×3 IMPLANT
NEEDLE PERC 18GX7CM (NEEDLE) ×3 IMPLANT
PACK CARDIAC CATH (CUSTOM PROCEDURE TRAY) ×3 IMPLANT
SHEATH AVANTI 5FR X 11CM (SHEATH) ×3 IMPLANT
SHEATH AVANTI 6FR X 11CM (SHEATH) ×3 IMPLANT
STENT XIENCE ALPINE RX 3.0X23 (Permanent Stent) ×3 IMPLANT
STENT XIENCE ALPINE RX 3.25X18 (Permanent Stent) ×3 IMPLANT
STENT XIENCE ALPINE RX 3.25X23 (Permanent Stent) ×3 IMPLANT
WIRE G HI TQ BMW 190 (WIRE) ×3 IMPLANT

## 2016-08-20 NOTE — Progress Notes (Addendum)
ANTICOAGULATION CONSULT NOTE - Initial Consult  Pharmacy Consult for Heparin Drip Indication: chest pain/ACS  No Known Allergies  Patient Measurements: Height: 5' 9.5" (176.5 cm) Weight: 249 lb (112.9 kg) IBW/kg (Calculated) : 71.85 Heparin Dosing Weight: 96.8 kg  Vital Signs: Temp: 97.9 F (36.6 C) (08/10 0713) Temp Source: Oral (08/10 0713) BP: 168/79 (08/10 0713) Pulse Rate: 70 (08/10 0745)  Labs:  Recent Labs  08/20/16 0711  HGB 15.8  HCT 46.5  PLT 141*  CREATININE 0.99  TROPONINI 0.14*    Estimated Creatinine Clearance: 90.4 mL/min (by C-G formula based on SCr of 0.99 mg/dL).   Medical History: Past Medical History:  Diagnosis Date  . Hypertension   . Myocardial infarction (HCC)     Medications:  Scheduled:  . heparin  4,000 Units Intravenous Once   Infusions:  . heparin      Assessment: 66 yo M w/ Chest pain to start Heparin drip for ?NSTEMI.  Per Med Rec: patient not on anticoagulants at home (just ASA)  Hgb 15.8  Plt 141   APTT 28    INR 1.00  Goal of Therapy:  Heparin level 0.3-0.7 units/ml Monitor platelets by anticoagulation protocol: Yes   Plan:  Give 4000 units bolus x 1 Start heparin infusion at 1250 units/hr Check anti-Xa level in 6 hours and daily while on heparin Continue to monitor H&H and platelets  Reichen Hutzler A 08/20/2016,8:56 AM

## 2016-08-20 NOTE — Progress Notes (Signed)
*  PRELIMINARY RESULTS* Echocardiogram 2D Echocardiogram has been performed. Definity administered at 7:45pm, no adverse reactions.  Curtis Clay  [facility] 08/20/2016, 7:47 PM

## 2016-08-20 NOTE — H&P (Signed)
[employer] - [place] [facility]   PATIENT NAME: Curtis Clay    MR#:  [id number]  DATE OF BIRTH:  March 29, 1950  DATE OF ADMISSION:  08/20/2016  PRIMARY CARE PHYSICIAN: Dione Housekeeperlmedo, Mario Ernesto, MD   REQUESTING/REFERRING PHYSICIAN: [email or web address]  CHIEF COMPLAINT:   Chief Complaint  Patient presents with  . Chest Pain    HISTORY OF PRESENT ILLNESS: Curtis Clay  is a 66 y.o. male with a known history of Hypertension, myocardial infarction, status post 3 stents in 2010 and follows regularly with Dr. Juliann Paresallwood. He takes his medication regularly as advised. For last few month has progressive decline in his strength and functional ability and also have some anginal episodes after working for 2-3 hours in his yard. Last night while he was resting started having pressure-like pain in the center of his chest which was nonradiating and 5-6 out of 10, continued until this morning so he decided to come to emergency room. It eased up some after receiving nitroglycerin tablet in ER. His EKG was no change from past and his troponin was noted 0.14 so ER physician suspected having non-ST elevation MI and started on heparin IV drip and given to hospitalist for further management.   PAST MEDICAL HISTORY:   Past Medical History:  Diagnosis Date  . Hypertension   . Myocardial infarction [facility](HCC)     PAST SURGICAL HISTORY: Past Surgical History:  Procedure Laterality Date  . CARDIAC SURGERY      SOCIAL HISTORY:  Social History  Substance Use Topics  . Smoking status: Never Smoker  . Smokeless tobacco: Never Used  . Alcohol use No    FAMILY HISTORY:  Family History  Problem Relation Age of Onset  . Vascular Disease Mother     DRUG ALLERGIES: No Known Allergies  REVIEW OF SYSTEMS:   CONSTITUTIONAL: No fever, fatigue or weakness.  EYES: No blurred or double vision.  EARS, NOSE, AND THROAT: No tinnitus or ear pain.  RESPIRATORY: No cough, shortness of breath, wheezing or hemoptysis.   CARDIOVASCULAR: Positive for chest pain, no orthopnea, edema.  GASTROINTESTINAL: No nausea, vomiting, diarrhea or abdominal pain.  GENITOURINARY: No dysuria, hematuria.  ENDOCRINE: No polyuria, nocturia,  HEMATOLOGY: No anemia, easy bruising or bleeding SKIN: No rash or lesion. MUSCULOSKELETAL: No joint pain or arthritis.   NEUROLOGIC: No tingling, numbness, weakness.  PSYCHIATRY: No anxiety or depression.   MEDICATIONS AT HOME:  Prior to Admission medications   Medication Sig Start Date End Date Taking? Authorizing Provider  aspirin EC 81 MG tablet Take 81 mg by mouth daily.   Yes [provider]  enalapril (VASOTEC) 5 MG tablet Take 5 mg by mouth daily.   Yes [provider]  metoprolol tartrate (LOPRESSOR) 25 MG tablet Take 25 mg by mouth daily.   Yes [provider]  simvastatin (ZOCOR) 40 MG tablet Take 40 mg by mouth daily.   Yes [provider]  cyclobenzaprine (FLEXERIL) 10 MG tablet Take 0.5 tablet orally during the day, one tablet at night as needed for muscle spasm. Do not drive while taking as can cause drowsiness. Patient not taking: Reported on 08/20/2016 07/28/16   Renford DillsMiller, Lindsey, NP      PHYSICAL EXAMINATION:   VITAL SIGNS: Blood pressure 129/70, pulse 62, temperature 97.9 F (36.6 C), temperature source Oral, resp. rate 17, height 5' 9.5" (1.765 m), weight 112.9 kg (249 lb), SpO2 94 %.  GENERAL:  66 y.o.-year-old patient lying in the bed with no acute distress.  EYES: Pupils equal, round, reactive to light and accommodation. No scleral icterus. Extraocular muscles intact.  HEENT: Head atraumatic, normocephalic. Oropharynx and nasopharynx clear.  NECK:  Supple, no jugular venous distention. No thyroid enlargement, no tenderness.  LUNGS: Normal breath sounds bilaterally, no wheezing, rales,rhonchi or crepitation. No use of accessory muscles of respiration.  CARDIOVASCULAR: S1, S2 normal. No murmurs, rubs, or gallops.  ABDOMEN: Soft,  nontender, nondistended. Bowel sounds present. No organomegaly or mass.  EXTREMITIES: No pedal edema, cyanosis, or clubbing.  NEUROLOGIC: Cranial nerves II through XII are intact. Muscle strength 5/5 in all extremities. Sensation intact. Gait not checked.  PSYCHIATRIC: The patient is alert and oriented x 3.  SKIN: No obvious rash, lesion, or ulcer.   LABORATORY PANEL:   CBC  Recent Labs Lab 08/20/16 0711  WBC 7.7  HGB 15.8  HCT 46.5  PLT 141*  MCV 92.3  MCH 31.5  MCHC 34.1  RDW 13.5   ------------------------------------------------------------------------------------------------------------------  Chemistries   Recent Labs Lab 08/20/16 0711  NA 138  K 4.3  CL 107  CO2 24  GLUCOSE 110*  BUN 14  CREATININE 0.99  CALCIUM 9.1   ------------------------------------------------------------------------------------------------------------------ estimated creatinine clearance is 90.4 mL/min (by C-G formula based on SCr of 0.99 mg/dL). ------------------------------------------------------------------------------------------------------------------ No results for input(s): TSH, T4TOTAL, T3FREE, THYROIDAB in the last 72 hours.  Invalid input(s): FREET3   Coagulation profile  Recent Labs Lab 08/20/16 0909  INR 1.00   ------------------------------------------------------------------------------------------------------------------- No results for input(s): DDIMER in the last 72 hours. -------------------------------------------------------------------------------------------------------------------  Cardiac Enzymes  Recent Labs Lab 08/20/16 0711  TROPONINI 0.14*   ------------------------------------------------------------------------------------------------------------------ Invalid input(s): POCBNP  ---------------------------------------------------------------------------------------------------------------  Urinalysis No results found for: COLORURINE,  APPEARANCEUR, LABSPEC, PHURINE, GLUCOSEU, HGBUR, BILIRUBINUR, KETONESUR, PROTEINUR, UROBILINOGEN, NITRITE, LEUKOCYTESUR   RADIOLOGY: Dg Chest 2 View  Result Date: 08/20/2016 CLINICAL DATA:  Chest pain EXAM: CHEST  2 VIEW COMPARISON:  06/22/2008 FINDINGS: Low volume chest with interstitial crowding. There is no edema, consolidation, effusion, or pneumothorax. Remote lateral left rib fractures. Borderline heart size for technique. Negative aortic and hilar contours. IMPRESSION: Low volume chest without acute finding. Electronically Signed   By: Marnee Spring M.D.   On: 08/20/2016 08:04    EKG: Orders placed or performed during the hospital encounter of 08/20/16  . EKG 12-Lead  . EKG 12-Lead  . ED EKG within 10 minutes  . ED EKG within 10 minutes    IMPRESSION AND PLAN:  * Non-ST elevation MI   IV heparin drip as ordered by ER physician, I agree with that and will continue.   Continue cardiac monitoring and follow serial troponins.   Check hemoglobin A1c and lipid panel.   Get echocardiogram.   Cardiology consult for further management.  * Hypertension   Continue home medications for now.  * History of CAD and stents   Continue present cardiac medications.  All the records are reviewed and case discussed with ED provider. Management plans discussed with the patient, family and they are in agreement.  CODE STATUS:Full code  Code Status History    This patient does not have a recorded code status. Please follow your organizational policy for patients in this situation.     Patient's wife was present in the room during my visit.  TOTAL TIME TAKING CARE OF THIS PATIENT: 50  minutes.    Altamese Dilling M.D on 08/20/2016   Between 7am to 6pm - Pager - [phone]  After 6pm go to [email or web address] - password [email or web address]  [employer]  Office  [phone]  CC: Primary care physician; Dione Housekeeper, MD   Note: This dictation was prepared with  Dragon dictation along with smaller phrase technology. Any transcriptional errors that result from this process are unintentional.

## 2016-08-20 NOTE — ED Notes (Signed)
Attempted report. Secretary reported RN would call back in 3-4 minutes

## 2016-08-20 NOTE — Progress Notes (Signed)
1715 Received patient from [facility] recovery nurse Flossie DibbleKathy Jarrett.

## 2016-08-20 NOTE — ED Provider Notes (Signed)
[facility] Emergency Department Provider Note   ____________________________________________    I have reviewed the triage vital signs and the nursing notes.   HISTORY  Chief Complaint Chest Pain     HPI Curtis Clay is a 66 y.o. male who presents with complaints of chest pain. Patient reports pain started yesterday while at rest and does not feel similar to his typical angina. Patient is a history of CAD, reports 3 stents in the past. Has not had a cardiac catheterization since 2010.Dr. Juliann Pares is his cardiologist. Patient reports typically he develops angina with exertion and then it quickly goes away when he rests however this has been constant throughout the night. He denies shortness of breath. No leg pain or swelling. No pleurisy. No shortness of breath. Does not radiate. He describes it as a mild pressure.   Past Medical History:  Diagnosis Date  . Hypertension   . Myocardial infarction (HCC)     There are no active problems to display for this patient.   Past Surgical History:  Procedure Laterality Date  . CARDIAC SURGERY      Prior to Admission medications   Medication Sig Start Date End Date Taking? Authorizing Provider  aspirin EC 81 MG tablet Take 81 mg by mouth daily.   Yes [provider]  enalapril (VASOTEC) 5 MG tablet Take 5 mg by mouth daily.   Yes [provider]  metoprolol tartrate (LOPRESSOR) 25 MG tablet Take 25 mg by mouth daily.   Yes [provider]  simvastatin (ZOCOR) 40 MG tablet Take 40 mg by mouth daily.   Yes [provider]  cyclobenzaprine (FLEXERIL) 10 MG tablet Take 0.5 tablet orally during the day, one tablet at night as needed for muscle spasm. Do not drive while taking as can cause drowsiness. Patient not taking: Reported on 08/20/2016 07/28/16   Renford Dills, NP     Allergies Patient has no known allergies.  History reviewed. No pertinent family  history.  Social History Social History  Substance Use Topics  . Smoking status: Never Smoker  . Smokeless tobacco: Never Used  . Alcohol use No    Review of Systems  Constitutional: No fever/chills Eyes: No visual changes.  ENT: No sore throat. Cardiovascular: As above Respiratory: Denies shortness of breath. Gastrointestinal: No abdominal pain.  No nausea, no vomiting.   Genitourinary: Negative for dysuria. Musculoskeletal: Negative for back pain. Skin: Negative for rash. Neurological: Negative for headaches    ____________________________________________   PHYSICAL EXAM:  VITAL SIGNS: ED Triage Vitals  Enc Vitals Group     BP 08/20/16 0713 (!) 168/79     Pulse Rate 08/20/16 0713 72     Resp 08/20/16 0713 18     Temp 08/20/16 0713 97.9 F (36.6 C)     Temp Source 08/20/16 0713 Oral     SpO2 08/20/16 0713 96 %     Weight 08/20/16 0714 112.9 kg (249 lb)     Height --      Head Circumference --      Peak Flow --      Pain Score 08/20/16 0713 6     Pain Loc --      Pain Edu? --      Excl. in GC? --     Constitutional: Alert and oriented. No acute distress. Pleasant and interactive Eyes: Conjunctivae are normal.  Head: Atraumatic. Nose: No congestion/rhinnorhea. Mouth/Throat: Mucous membranes are moist.    Cardiovascular: Normal  rate, regular rhythm. Grossly normal heart sounds.  Good peripheral circulation. Respiratory: Normal respiratory effort.  No retractions. Lungs CTAB. Gastrointestinal: Soft and nontender. No distention.  No CVA tenderness. Genitourinary: deferred Musculoskeletal: No lower extremity tenderness nor edema.  Warm and well perfused Neurologic:  Normal speech and language. No gross focal neurologic deficits are appreciated.  Skin:  Skin is warm, dry and intact. No rash noted. Psychiatric: Mood and affect are normal. Speech and behavior are normal.  ____________________________________________   LABS (all labs ordered are listed, but  only abnormal results are displayed)  Labs Reviewed  CBC - Abnormal; Notable for the following:       Result Value   Platelets 141 (*)    All other components within normal limits  BASIC METABOLIC PANEL  TROPONIN I   ____________________________________________  EKG  ED ECG REPORT I, Jene EveryKINNER, Blue Ruggerio, the attending physician, personally viewed and interpreted this ECG.  Date: 08/20/2016  Rhythm: normal sinus rhythm QRS Axis: normal Intervals: normal ST/T Wave abnormalities: normal Narrative Interpretation: unremarkable  ____________________________________________  RADIOLOGY  Chest x-ray unremarkable ____________________________________________   PROCEDURES  Procedure(s) performed: No    Critical Care performed: yes  CRITICAL CARE Performed by: Jene EveryKINNER, Kaiyla Stahly   Total critical care time: 30 minutes  Critical care time was exclusive of separately billable procedures and treating other patients.  Critical care was necessary to treat or prevent imminent or life-threatening deterioration.  Critical care was time spent personally by me on the following activities: development of treatment plan with patient and/or surrogate as well as nursing, discussions with consultants, evaluation of patient's response to treatment, examination of patient, obtaining history from patient or surrogate, ordering and performing treatments and interventions, ordering and review of laboratory studies, ordering and review of radiographic studies, pulse oximetry and re-evaluation of patient's condition.  ____________________________________________   INITIAL IMPRESSION / ASSESSMENT AND PLAN / ED COURSE  Pertinent labs & imaging results that were available during my care of the patient were reviewed by me and considered in my medical decision making (see chart for details).  Patient presents with complaints of chest pain. He has a significant history of coronary artery disease, reports  compliance with his medications. Has not taken an aspirin or nitroglycerin glycerin today. We will give these in the emergency department while we await chemistries. EKG is reassuring   ----------------------------------------- 8:43 AM on 08/20/2016 -----------------------------------------  Notified of elevated troponin 0.14. Given patient's presentation and history I will start him on heparin as this is consistent with an NSTEMI. Patient feels better after ntg. Vitals stable. Signed out to hospitalist service    ____________________________________________   FINAL CLINICAL IMPRESSION(S) / ED DIAGNOSES  Final diagnoses:  NSTEMI (non-ST elevated myocardial infarction) (HCC)      NEW MEDICATIONS STARTED DURING THIS VISIT:  New Prescriptions   No medications on file     Note:  This document was prepared using Dragon voice recognition software and may include unintentional dictation errors.    Jene EveryKinner, Rik Wadel, MD 08/20/16 [phone]

## 2016-08-20 NOTE — ED Triage Notes (Signed)
Pt to ed with c/o chest pain that started yesterday.  Pt states pain is constant, sharp, center of chest and nonradiating.  Pt denies sob, denies weakness associated with the pain.

## 2016-08-20 NOTE — Consult Note (Signed)
PULMONARY / CRITICAL CARE MEDICINE   Name: Curtis Clay MRN: [id number] DOB: 1950/04/21    ADMISSION DATE:  08/20/2016   CONSULTATION DATE:  08/20/16  REFERRING MD:  Dr Rushie Goltz  REASON: ICU monitoring for and NSTEMI  CHIEF COMPLAINT: Chest pain    HISTORY OF PRESENT ILLNESS:   This is a 66 year old Caucasian male with a known history of coronary artery disease and PCI 3 who presented to the ED with midsternal chest pain. She will he was seen by cardiology and ruled in for a non-ST elevation MI. He was admitted to the ICU for overnight monitoring. Patient is currently awake and no complaints. He reports complete resolution of symptoms. His troponin peaked at 1.3 and he has no further EKG changes. Stop his 2-D echo showed a left ventricular ejection fraction of 66 5%  PAST MEDICAL HISTORY :  He  has a past medical history of Hypertension and Myocardial infarction (HCC).  PAST SURGICAL HISTORY: He  has a past surgical history that includes Cardiac surgery.  No Known Allergies  No current facility-administered medications on file prior to encounter.    Current Outpatient Prescriptions on File Prior to Encounter  Medication Sig  . aspirin EC 81 MG tablet Take 81 mg by mouth daily.  . simvastatin (ZOCOR) 40 MG tablet Take 40 mg by mouth daily.  . cyclobenzaprine (FLEXERIL) 10 MG tablet Take 0.5 tablet orally during the day, one tablet at night as needed for muscle spasm. Do not drive while taking as can cause drowsiness. (Patient not taking: Reported on 08/20/2016)    FAMILY HISTORY:  His indicated that the status of his mother is unknown.    SOCIAL HISTORY: He  reports that he has never smoked. He has never used smokeless tobacco. He reports that he does not drink alcohol or use drugs.  REVIEW OF SYSTEMS:   Constitutional: Negative for fever and chills.  HENT: Negative for congestion and rhinorrhea.  Eyes: Negative for redness and visual disturbance.  Respiratory: Negative  for shortness of breath and wheezing.  Cardiovascular: Negative for chest pain and palpitations.  Gastrointestinal: Negative  for nausea , vomiting and abdominal pain and  Loose stools Genitourinary: Negative for dysuria and urgency.  Endocrine: Denies polyuria, polyphagia and heat intolerance Musculoskeletal: Negative for myalgias and arthralgias.  Skin: Negative for pallor and wound.  Neurological: Negative for dizziness and headaches   SUBJECTIVE:   VITAL SIGNS: BP (!) 147/73   Pulse 62   Temp 97.9 F (36.6 C) (Oral)   Resp 16   Ht 5' 9.5" (1.765 m)   Wt 249 lb (112.9 kg)   SpO2 98%   BMI 36.24 kg/m   HEMODYNAMICS:    VENTILATOR SETTINGS:    INTAKE / OUTPUT: I/O last 3 completed shifts: In: 31.5 [I.V.:31.5] Out: -   PHYSICAL EXAMINATION: General: No acute distress Neuro: Alert and oriented 4. No focal deficits HEENT:  PERRLA, oral mucosa moist Cardiovascular:RRR, S1/S2, no MRG, +2 pulses, no edema Lungs:  CTAB Abdomen:  Normal bowel sounds Musculoskeletal:  No deformities Skin:  Warm and dry  LABS:  BMET  Recent Labs Lab 08/20/16 0711  NA 138  K 4.3  CL 107  CO2 24  BUN 14  CREATININE 0.99  GLUCOSE 110*    Electrolytes  Recent Labs Lab 08/20/16 0711  CALCIUM 9.1    CBC  Recent Labs Lab 08/20/16 0711  WBC 7.7  HGB 15.8  HCT 46.5  PLT 141*    Coag's  Recent Labs Lab 08/20/16 0909  APTT 28  INR 1.00    Sepsis Markers No results for input(s): LATICACIDVEN, PROCALCITON, O2SATVEN in the last 168 hours.  ABG No results for input(s): PHART, PCO2ART, PO2ART in the last 168 hours.  Liver Enzymes No results for input(s): AST, ALT, ALKPHOS, BILITOT, ALBUMIN in the last 168 hours.  Cardiac Enzymes  Recent Labs Lab 08/20/16 0711 08/20/16 1024 08/20/16 1929  TROPONINI 0.14* 0.24* 1.10*    Glucose  Recent Labs Lab 08/20/16 1728  GLUCAP 104*    Imaging Dg Chest 2 View  Result Date: 08/20/2016 CLINICAL DATA:   Chest pain EXAM: CHEST  2 VIEW COMPARISON:  06/22/2008 FINDINGS: Low volume chest with interstitial crowding. There is no edema, consolidation, effusion, or pneumothorax. Remote lateral left rib fractures. Borderline heart size for technique. Negative aortic and hilar contours. IMPRESSION: Low volume chest without acute finding. Electronically Signed   By: Marnee SpringJonathon  Watts M.D.   On: 08/20/2016 08:04     STUDIES:  2 D Echo SIGNIFICANT EVENTS: 8/10>ED with CO>NTSEMI  LINES/TUBES: PIV  DISCUSSION: 66 year old Caucasian male presenting with a non-ST elevation MI  ASSESSMENT  Non-ST elevation MI History of hypertension History of coronary artery disease, status post PCI 3  PLAN  cardiology following Follow-up EKG  Cycle cardiac enzymes   Aspirin, statin, Plavix, nitroglycerin and beta blocker per cardiology  FAMILY  - Updates: No family at bedside. Patient updated on current treatment plan  - Inter-disciplinary family meet or Palliative Care meeting due by:  day 7  Transfer to telemetry is stable in the morning   Magdalene S. [facility] ANP-BC Pulmonary and Critical Care Medicine [facility] Pager [phone] or [phone]: [phone]  08/20/2016, 9:26 PM   STAFF NOTE: I. Dr. Nicholos Johnsamachandran, have personally reviewed the patient's available data including medical history , events of notes, physican examination and test results as part of my evaluation. I have discussed with the  Care with the NP and other care providers including  pharmacist, ICU RN, RRT, dietary.  Physical Exam Lungs - Decreased air entry bilaterally.   Pt presented with chest pain c/w acute NSTEMI, underwent cath, notes reviewed, d/w cardiology. Pt notes that CP is resolving, and is feeling better. Can increase activity.   Wells Guileseep Deshawna Mcneece, MD.   Board Certified in Internal Medicine, Pulmonary Medicine, Critical Care Medicine, and Sleep Medicine.  [facility]  Number: [phone] Pager: [phone]  Santiago Gladavid Kasa, M.D.  Billy Fischeravid Simonds, M.D

## 2016-08-20 NOTE — Consult Note (Signed)
[facility] CARDIOLOGY [facility]  CARDIOLOGY CONSULT NOTE  Patient ID: Curtis Clay MRN: [id number] DOB/AGE: March 14, 1950 66 y.o.  Admit date: 08/20/2016 Referring Physician Dr. Elisabeth Pigeon Primary Physician   Primary Cardiologist Dr. Juliann Pares Reason for Consultation nstemi  HPI: Patient is a 66 year old male with history of coronary artery disease status post PCI 3 in the past with residual disease who was admitted after presenting to emergency room with midsternal chest pain. Patient had no electric cardiographic changes consistent with ischemia however has had a mild troponin elevation to a peak of 0.24. He is pain-free at present after aspirin and heparin. He remains on IV heparin and aspirin. Electrocardiogram sinus rhythm with nonspecific ST-T wave changes. There is no acute ischemia noted. Patient has been compliant with his medications. He is unaware of any rapid or irregular heartbeats.  Review of Systems  Constitutional: Negative.   HENT: Negative.   Eyes: Negative.   Respiratory: Positive for shortness of breath.   Cardiovascular: Positive for chest pain.  Gastrointestinal: Negative.   Musculoskeletal: Negative.   Skin: Negative.   Neurological: Negative.   Endo/Heme/Allergies: Negative.   Psychiatric/Behavioral: Negative.     Past Medical History:  Diagnosis Date  . Hypertension   . Myocardial infarction [facility])     Family History  Problem Relation Age of Onset  . Vascular Disease Mother     Social History   Social History  . Marital status: Married    Spouse name: N/A  . Number of children: N/A  . Years of education: N/A   Occupational History  . Not on file.   Social History Main Topics  . Smoking status: Never Smoker  . Smokeless tobacco: Never Used  . Alcohol use No  . Drug use: No  . Sexual activity: Not on file   Other Topics Concern  . Not on file   Social History Narrative  . No narrative on file    Past  Surgical History:  Procedure Laterality Date  . CARDIAC SURGERY       Prescriptions Prior to Admission  Medication Sig Dispense Refill Last Dose  . aspirin EC 81 MG tablet Take 81 mg by mouth daily.   08/19/2016 at 0800  . enalapril (VASOTEC) 5 MG tablet Take 5 mg by mouth daily.   08/19/2016 at 0800  . metoprolol tartrate (LOPRESSOR) 25 MG tablet Take 25 mg by mouth daily.   08/19/2016 at 0800  . simvastatin (ZOCOR) 40 MG tablet Take 40 mg by mouth daily.   08/19/2016 at 0800  . cyclobenzaprine (FLEXERIL) 10 MG tablet Take 0.5 tablet orally during the day, one tablet at night as needed for muscle spasm. Do not drive while taking as can cause drowsiness. (Patient not taking: Reported on 08/20/2016) 20 tablet 0 Not Taking at Unknown time    Physical Exam: Blood pressure 140/79, pulse 60, temperature 97.6 F (36.4 C), temperature source Oral, resp. rate 14, height 5' 9.5" (1.765 m), weight 112.9 kg (249 lb), SpO2 98 %.   Wt Readings from Last 1 Encounters:  08/20/16 112.9 kg (249 lb)     General appearance: alert and cooperative Resp: clear to auscultation bilaterally Chest wall: no tenderness Cardio: regular rate and rhythm GI: soft, non-tender; bowel sounds normal; no masses,  no organomegaly Extremities: extremities normal, atraumatic, no cyanosis or edema Pulses: 2+ and symmetric Neurologic: Grossly normal  Labs:   Lab Results  Component Value Date   WBC 7.7 08/20/2016  HGB 15.8 08/20/2016   HCT 46.5 08/20/2016   MCV 92.3 08/20/2016   PLT 141 (L) 08/20/2016    Recent Labs Lab 08/20/16 0711  NA 138  K 4.3  CL 107  CO2 24  BUN 14  CREATININE 0.99  CALCIUM 9.1  GLUCOSE 110*   Lab Results  Component Value Date   TROPONINI 0.24 (HH) 08/20/2016      Radiology: No acute cardiopulmonary disease EKG: Sinus rhythm with no acute ischemic changes  ASSESSMENT AND PLAN:  66 year old male with history of coronary disease status post PCI in the distant past who now presents  with complaints of chest pain. He has ruled in for non-ST elevation myocardial infarction with serum troponin 0.24. Pain was similar to his he currently is pain-free on aspirin and heparin. We'll continue with current regimen and proceed with left cardiac catheterization as soon as possible to guide further therapy. Further recognitions after catheter is complete. Signed: Dalia HeadingKenneth A Kesean Serviss MD, [facility] 08/20/2016, 12:54 PM

## 2016-08-21 ENCOUNTER — Other Ambulatory Visit: Payer: Self-pay

## 2016-08-21 DIAGNOSIS — I10 Essential (primary) hypertension: Secondary | ICD-10-CM

## 2016-08-21 DIAGNOSIS — Z9861 Coronary angioplasty status: Secondary | ICD-10-CM

## 2016-08-21 DIAGNOSIS — I2510 Atherosclerotic heart disease of native coronary artery without angina pectoris: Secondary | ICD-10-CM

## 2016-08-21 DIAGNOSIS — I214 Non-ST elevation (NSTEMI) myocardial infarction: Secondary | ICD-10-CM | POA: Diagnosis not present

## 2016-08-21 LAB — MRSA PCR SCREENING: MRSA by PCR: NEGATIVE

## 2016-08-21 LAB — CBC
HEMATOCRIT: 44.4 % (ref 40.0–52.0)
HEMOGLOBIN: 15.4 g/dL (ref 13.0–18.0)
MCH: 31.8 pg (ref 26.0–34.0)
MCHC: 34.6 g/dL (ref 32.0–36.0)
MCV: 91.7 fL (ref 80.0–100.0)
Platelets: 127 10*3/uL — ABNORMAL LOW (ref 150–440)
RBC: 4.84 MIL/uL (ref 4.40–5.90)
RDW: 13.5 % (ref 11.5–14.5)
WBC: 9.1 10*3/uL (ref 3.8–10.6)

## 2016-08-21 LAB — BASIC METABOLIC PANEL
Anion gap: 7 (ref 5–15)
BUN: 12 mg/dL (ref 6–20)
CHLORIDE: 106 mmol/L (ref 101–111)
CO2: 25 mmol/L (ref 22–32)
CREATININE: 0.94 mg/dL (ref 0.61–1.24)
Calcium: 8.9 mg/dL (ref 8.9–10.3)
GFR calc Af Amer: >60 mL/min (ref >60)
GFR calc non Af Amer: >60 mL/min (ref >60)
Glucose, Bld: 104 mg/dL — ABNORMAL HIGH (ref 65–99)
Potassium: 3.9 mmol/L (ref 3.5–5.1)
Sodium: 138 mmol/L (ref 135–145)

## 2016-08-21 LAB — ECHOCARDIOGRAM COMPLETE
HEIGHTINCHES: 69.5 in
Weight: 3984 oz

## 2016-08-21 LAB — TROPONIN I: Troponin I: 1.36 ng/mL (ref <0.03)

## 2016-08-21 LAB — MAGNESIUM: Magnesium: 1.9 mg/dL (ref 1.7–2.4)

## 2016-08-21 LAB — PHOSPHORUS: Phosphorus: 3.9 mg/dL (ref 2.5–4.6)

## 2016-08-21 MED ORDER — ATORVASTATIN CALCIUM 10 MG PO TABS: 10.0000 mg | ORAL_TABLET | Freq: Every day | ORAL | 1 refills | Status: DC

## 2016-08-21 MED ORDER — CLOPIDOGREL BISULFATE 75 MG PO TABS: 75.0000 mg | ORAL_TABLET | Freq: Every day | ORAL | 1 refills | Status: AC

## 2016-08-21 NOTE — Progress Notes (Signed)
Discharge teaching done. Education given on procedure,follow-up and new medications.Questions answered.

## 2016-08-21 NOTE — Progress Notes (Signed)
Discharged home with wife

## 2016-08-21 NOTE — Progress Notes (Signed)
[facility]  SUBJECTIVE: No chest pain. Mild cath site discomfort. Distal pulses 2+   Vitals:   08/21/16 0500 08/21/16 0600 08/21/16 0700 08/21/16 0800  BP: (!) 141/72 (!) 145/72 131/65 (!) 109/56  Pulse: 69 75 77 72  Resp: (!) 21 19 (!) 23 19  Temp:      TempSrc:      SpO2: 96% 97% 95% 95%  Weight:      Height:        Intake/Output Summary (Last 24 hours) at 08/21/16 0921 Last data filed at 08/21/16 0230  Gross per 24 hour  Intake            31.46 ml  Output             1500 ml  Net         -1468.54 ml    LABS: Basic Metabolic Panel:  Recent Labs  09/81/1906/10/29 0711 08/21/16 0001 08/21/16 0003  NA 138 138  --   K 4.3 3.9  --   CL 107 106  --   CO2 24 25  --   GLUCOSE 110* 104*  --   BUN 14 12  --   CREATININE 0.99 0.94  --   CALCIUM 9.1 8.9  --   MG  --   --  1.9  PHOS  --   --  3.9   Liver Function Tests: No results for input(s): AST, ALT, ALKPHOS, BILITOT, PROT, ALBUMIN in the last 72 hours. No results for input(s): LIPASE, AMYLASE in the last 72 hours. CBC:  Recent Labs  08/20/16 0711 08/21/16 0001  WBC 7.7 9.1  HGB 15.8 15.4  HCT 46.5 44.4  MCV 92.3 91.7  PLT 141* 127*   Cardiac Enzymes:  Recent Labs  08/20/16 1024 08/20/16 1929 08/21/16 0001  TROPONINI 0.24* 1.10* 1.36*   BNP: Invalid input(s): POCBNP D-Dimer: No results for input(s): DDIMER in the last 72 hours. Hemoglobin A1C:  Recent Labs  08/20/16 1024  HGBA1C 5.7*   Fasting Lipid Panel:  Recent Labs  08/20/16 1024  CHOL 122  HDL 36*  LDLCALC 80  TRIG 32  CHOLHDL 3.4   Thyroid Function Tests: No results for input(s): TSH, T4TOTAL, T3FREE, THYROIDAB in the last 72 hours.  Invalid input(s): FREET3 Anemia Panel: No results for input(s): VITAMINB12, FOLATE, FERRITIN, TIBC, IRON, RETICCTPCT in the last 72 hours.   Physical Exam: Blood pressure (!) 109/56, pulse 72, temperature 97.9 F (36.6 C), temperature source  Oral, resp. rate 19, height 5' 9.5" (1.765 m), weight 112.9 kg (249 lb), SpO2 95 %.   Wt Readings from Last 1 Encounters:  08/20/16 112.9 kg (249 lb)     General appearance: alert and cooperative Resp: clear to auscultation bilaterally Cardio: regular rate and rhythm GI: soft, non-tender; bowel sounds normal; no masses,  no organomegaly Pulses: 2+ and symmetric Neurologic: Grossly normal Cath in right femoral artery intact. Dressing in place. No bleeding or hematoma TELEMETRY: Reviewed telemetry pt in nsr:  ASSESSMENT AND PLAN:  Principal Problem:   NSTEMI (non-ST elevated myocardial infarction) (HCC)-Ruled in for a small nstemi. S/P PCI of lad and lcx. Difficult intervention with 400 cc contrast. Renal function normal. No evidence of radiation skin effects thus far. Instructed patient and wife to watch for skin lesions on back and chest. OK for discharge form cardiac standpoint on  Asa 81 mg daily, plavix 75 mg daily, atorvastatin 10 mg daily, (pt on  low dose atorvastatin as he has not tolerated high intensity statins in the past), enalapril 5 mg daily, metoprolol tartrate 25 mg bid. Phase II cardiac rehab recommended and ordered. Follow up with Dr. Juliann Pares in 7-10 days.      Dalia Heading, MD, [facility] 08/21/2016 9:21 AM

## 2016-08-21 NOTE — Discharge Instructions (Signed)
Acute Coronary Syndrome °Acute coronary syndrome (ACS) is a serious problem in which there is suddenly not enough blood and oxygen supplied to the heart. ACS may mean that one or more of the blood vessels in your heart (coronary arteries) may be blocked. ACS can result in chest pain or a heart attack (myocardial infarction or MI). °What are the causes? °This condition is caused by atherosclerosis, which is the buildup of fat and cholesterol (plaque) on the inside of the arteries. Over time, the plaque may narrow or block the artery, and this will lessen blood flow to the heart. Plaque can also become weak and break off within a coronary artery to form a clot and cause a sudden blockage. °What increases the risk? °The risk factors of this condition include: °· High cholesterol levels. °· High blood pressure (hypertension). °· Smoking. °· Diabetes. °· Age. °· Family history of chest pain, heart disease, or stroke. °· Lack of exercise. °What are the signs or symptoms? °The most common signs of this condition include: °· Chest pain, which can be: °¨ A crushing or squeezing in the chest. °¨ A tightness, pressure, fullness, or heaviness in the chest. °¨ Present for more than a few minutes, or it can stop and recur. °· Pain in the arms, neck, jaw, or back. °· Unexplained heartburn or indigestion. °· Shortness of breath. °· Nausea. °· Sudden cold sweats. °· Feeling light-headed or dizzy. °Sometimes, this condition has no symptoms. °How is this diagnosed? °ACS may be diagnosed through the following tests: °· Electrocardiogram (ECG). °· Blood tests. °· Coronary angiogram. This is a procedure to look at the coronary arteries to see if there is any blockage. °How is this treated? °Treatment for ACS may include: °· Healthy behavioral changes to reduce or control risk factors. °· Medicine. °· Coronary stenting. A stent helps to keep an artery open. °· Coronary angioplasty. This procedure widens a narrowed or blocked  artery. °· Coronary artery bypass surgery. This will allow your blood to pass the blockage (bypass) to reach your heart. °Follow these instructions at home: °Eating and drinking °· Follow a heart-healthy diet. A dietitian can you help to educate you about healthy food options and changes. °· Use healthy cooking methods such as roasting, grilling, broiling, baking, poaching, steaming, or stir-frying. Talk to a dietitian to learn more about healthy cooking methods. °Medicines °· Take medicines only as directed by your health care provider. °· Do not take the following medicines unless your health care provider approves: °¨ Nonsteroidal anti-inflammatory drugs (NSAIDs), such as ibuprofen, naproxen, or celecoxib. °¨ Vitamin supplements that contain vitamin A, vitamin E, or both. °¨ Hormone replacement therapy that contains estrogen with or without progestin. °· Stop illegal drug use. °Activity °· Follow an exercise program that is approved by your health care provider. °· Plan rest periods when you are fatigued. °Lifestyle °· Do not use any tobacco products, including cigarettes, chewing tobacco, or electronic cigarettes. If you need help quitting, ask your health care provider. °· If you drink alcohol, and your health care provider approves, limit your alcohol intake to no more than 1 drink per day. One drink equals 12 ounces of beer, 5 ounces of wine, or 1½ ounces of hard liquor. °· Learn to manage stress. °· Maintain a healthy weight. Lose weight as approved by your health care provider. °General instructions °· Manage other health conditions, such as hypertension and diabetes, as directed by your health care provider. °· Keep all follow-up visits as directed by your   health care provider. This is important. °· Your health care provider may ask you to monitor your blood pressure. A blood pressure reading consists of a higher number over a lower number, such as 110 over 72, written as 110/72. Ideally, your blood  pressure should be: °¨ Below 140/90 if you have no other medical conditions. °¨ Below 130/80 if you have diabetes or kidney disease. °Get help right away if: °· You have pain in your chest, neck, arm, jaw, stomach, or back that lasts more than a few minutes, is recurring, or is not relieved by taking medicine under your tongue (sublingual nitroglycerin). °· You have profuse sweating without cause. °· You have unexplained: °¨ Heartburn or indigestion. °¨ Shortness of breath or difficulty breathing. °¨ Nausea or vomiting. °¨ Fatigue. °¨ Feelings of nervousness or anxiety. °¨ Weakness. °¨ Diarrhea. °· You have sudden light-headedness or dizziness. °· You faint. °These symptoms may represent a serious problem that is an emergency. Do not wait to see if the symptoms will go away. Get medical help right away. Call your local emergency services (911 in the [place] Do not drive yourself to the clinic or hospital.  °This information is not intended to replace advice given to you by your health care provider. Make sure you discuss any questions you have with your health care provider. °Document Released: 12/28/2004 Document Revised: 06/11/2015 Document Reviewed: 05/01/2013 °Elsevier Interactive Patient Education © 2017 [employer] ° °

## 2016-08-21 NOTE — Discharge Summary (Signed)
[facility] - [place]   PATIENT NAME: Curtis Clay    MR#:  [id number]  DATE OF BIRTH:  [birth year]  DATE OF ADMISSION:  08/20/2016 ADMITTING PHYSICIAN: Altamese Dilling, MD  DATE OF DISCHARGE: 08/21/2016   PRIMARY CARE PHYSICIAN: Dione Housekeeper, MD    ADMISSION DIAGNOSIS:  NSTEMI (non-ST elevated myocardial infarction) (HCC) [I21.4]  DISCHARGE DIAGNOSIS:  Principal Problem:   NSTEMI (non-ST elevated myocardial infarction) [facility]) Active Problems:   CAD S/P percutaneous coronary angioplasty   Essential hypertension   SECONDARY DIAGNOSIS:   Past Medical History:  Diagnosis Date  . Hypertension   . Myocardial infarction [facility])     HOSPITAL COURSE:   * Non-ST elevation MI   IV heparin drip Started in ER but his troponin continued to rise up so taken to cardiac catheterization and stent in LAD placed.     Check hemoglobin A1c- 5.7 and lipid panel- LDL is 80.   Done echocardiogram- EF 60%.   Cardiology consult appreciated, suggested to follow with Dr. Juliann Pares in office.  * Hypertension   Continue home medications for now.  * History of CAD and stents   Continue present cardiac medications.   DISCHARGE CONDITIONS:   Stable  CONSULTS OBTAINED:  Treatment Team:  Dalia Heading, MD  DRUG ALLERGIES:  No Known Allergies  DISCHARGE MEDICATIONS:   Current Discharge Medication List    START taking these medications   Details  atorvastatin (LIPITOR) 10 MG tablet Take 1 tablet (10 mg total) by mouth daily at 6 PM. Qty: 30 tablet, Refills: 1    clopidogrel (PLAVIX) 75 MG tablet Take 1 tablet (75 mg total) by mouth daily with breakfast. Qty: 30 tablet, Refills: 1      CONTINUE these medications which have NOT CHANGED   Details  aspirin EC 81 MG tablet Take 81 mg by mouth daily.    enalapril (VASOTEC) 5 MG tablet Take 5 mg by mouth daily.    metoprolol tartrate (LOPRESSOR) 25 MG tablet Take 25 mg by mouth  daily.    cyclobenzaprine (FLEXERIL) 10 MG tablet Take 0.5 tablet orally during the day, one tablet at night as needed for muscle spasm. Do not drive while taking as can cause drowsiness. Qty: 20 tablet, Refills: 0      STOP taking these medications     simvastatin (ZOCOR) 40 MG tablet          DISCHARGE INSTRUCTIONS:    Follow with cardiologist in 1 week. Counseled about dietary restrictions and healthy diet.  If you experience worsening of your admission symptoms, develop shortness of breath, life threatening emergency, suicidal or homicidal thoughts you must seek medical attention immediately by calling 911 or calling your MD immediately  if symptoms less severe.  You Must read complete instructions/literature along with all the possible adverse reactions/side effects for all the Medicines you take and that have been prescribed to you. Take any new Medicines after you have completely understood and accept all the possible adverse reactions/side effects.   Please note  You were cared for by a hospitalist during your hospital stay. If you have any questions about your discharge medications or the care you received while you were in the hospital after you are discharged, you can call the unit and asked to speak with the hospitalist on call if the hospitalist that took care of you is not available. Once you are discharged, your primary care physician will handle any further medical issues. Please  note that NO REFILLS for any discharge medications will be authorized once you are discharged, as it is imperative that you return to your primary care physician (or establish a relationship with a primary care physician if you do not have one) for your aftercare needs so that they can reassess your need for medications and monitor your lab values.    Today   CHIEF COMPLAINT:   Chief Complaint  Patient presents with  . Chest Pain    HISTORY OF PRESENT ILLNESS:  Curtis Clay Curtis Clay  is a 66  y.o. male with a known history of Hypertension, myocardial infarction, status post 3 stents in 2010 and follows regularly with Dr. Juliann Pares[place]wood. He takes his medication regularly as advised. For last few month has progressive decline in his strength and functional ability and also have some anginal episodes after working for 2-3 hours in his yard. Last night while he was resting started having pressure-like pain in the center of his chest which was nonradiating and 5-6 out of 10, continued until this morning so he decided to come to emergency room. It eased up some after receiving nitroglycerin tablet in ER. His EKG was no change from past and his troponin was noted 0.14 so ER physician suspected having non-ST elevation MI and started on heparin IV drip and given to hospitalist for further management.   VITAL SIGNS:  Blood pressure 139/84, pulse 96, temperature 98.8 F (37.1 C), resp. rate 19, height 5' 9.5" (1.765 m), weight 112.9 kg (249 lb), SpO2 95 %.  I/O:   Intake/Output Summary (Last 24 hours) at 08/21/16 1214 Last data filed at 08/21/16 0900  Gross per 24 hour  Intake           781.46 ml  Output             1700 ml  Net          -918.54 ml    PHYSICAL EXAMINATION:  GENERAL:  66 y.o.-year-old patient lying in the bed with no acute distress.  EYES: Pupils equal, round, reactive to light and accommodation. No scleral icterus. Extraocular muscles intact.  HEENT: Head atraumatic, normocephalic. Oropharynx and nasopharynx clear.  NECK:  Supple, no jugular venous distention. No thyroid enlargement, no tenderness.  LUNGS: Normal breath sounds bilaterally, no wheezing, rales,rhonchi or crepitation. No use of accessory muscles of respiration.  CARDIOVASCULAR: S1, S2 normal. No murmurs, rubs, or gallops.  ABDOMEN: Soft, non-tender, non-distended. Bowel sounds present. No organomegaly or mass.  EXTREMITIES: No pedal edema, cyanosis, or clubbing.  NEUROLOGIC: Cranial nerves II through XII are  intact. Muscle strength 5/5 in all extremities. Sensation intact. Gait not checked.  PSYCHIATRIC: The patient is alert and oriented x 3.  SKIN: No obvious rash, lesion, or ulcer.   DATA REVIEW:   CBC  Recent Labs Lab 08/21/16 0001  WBC 9.1  HGB 15.4  HCT 44.4  PLT 127*    Chemistries   Recent Labs Lab 08/21/16 0001 08/21/16 0003  NA 138  --   K 3.9  --   CL 106  --   CO2 25  --   GLUCOSE 104*  --   BUN 12  --   CREATININE 0.94  --   CALCIUM 8.9  --   MG  --  1.9    Cardiac Enzymes  Recent Labs Lab 08/21/16 0001  TROPONINI 1.36*    Microbiology Results  Results for orders placed or performed during the hospital encounter of 08/20/16  MRSA PCR Screening  Status: None   Collection Time: 08/21/16 12:02 AM  Result Value Ref Range Status   MRSA by PCR NEGATIVE NEGATIVE Final    Comment:        The GeneXpert MRSA Assay (FDA approved for NASAL specimens only), is one component of a comprehensive MRSA colonization surveillance program. It is not intended to diagnose MRSA infection nor to guide or monitor treatment for MRSA infections.     RADIOLOGY:  Dg Chest 2 View  Result Date: 08/20/2016 CLINICAL DATA:  Chest pain EXAM: CHEST  2 VIEW COMPARISON:  06/22/2008 FINDINGS: Low volume chest with interstitial crowding. There is no edema, consolidation, effusion, or pneumothorax. Remote lateral left rib fractures. Borderline heart size for technique. Negative aortic and hilar contours. IMPRESSION: Low volume chest without acute finding. Electronically Signed   By: Marnee Spring M.D.   On: 08/20/2016 08:04    EKG:   Orders placed or performed during the hospital encounter of 08/20/16  . EKG 12-Lead  . EKG 12-Lead  . EKG 12-Lead  . EKG 12-Lead      Management plans discussed with the patient, family and they are in agreement.  CODE STATUS:     Code Status Orders        Start     Ordered   08/20/16 1138  Full code  Continuous     08/20/16  1137    Code Status History    Date Active Date Inactive Code Status Order ID Comments User Context   This patient has a current code status but no historical code status.      TOTAL TIME TAKING CARE OF THIS PATIENT: 35 minutes.    Altamese Dilling M.D on 08/21/2016 at 12:14 PM  Between 7am to 6pm - Pager - [phone]  After 6pm go to [email or web address] - password [email or web address]  [facility]  Office  [phone]  CC: Primary care physician; Dione Housekeeper, MD   Note: This dictation was prepared with Dragon dictation along with smaller phrase technology. Any transcriptional errors that result from this process are unintentional.

## 2016-08-23 ENCOUNTER — Encounter: Payer: Self-pay | Admitting: Cardiology

## 2017-08-25 DIAGNOSIS — E669 Obesity, unspecified: Secondary | ICD-10-CM | POA: Insufficient documentation

## 2017-10-25 DIAGNOSIS — G4733 Obstructive sleep apnea (adult) (pediatric): Secondary | ICD-10-CM | POA: Insufficient documentation

## 2018-08-16 DIAGNOSIS — D696 Thrombocytopenia, unspecified: Secondary | ICD-10-CM | POA: Insufficient documentation

## 2018-10-26 IMAGING — CR DG CHEST 2V
1 series · 2 of 2 positions shown · non-contrast
Comparison: 06/22/2008

CLINICAL DATA: Chest pain

EXAM:
CHEST  2 VIEW

[Series 1: dg chest 2 view · 0.14mm/px · 2 of 2 slices shown]
[im 1/2]
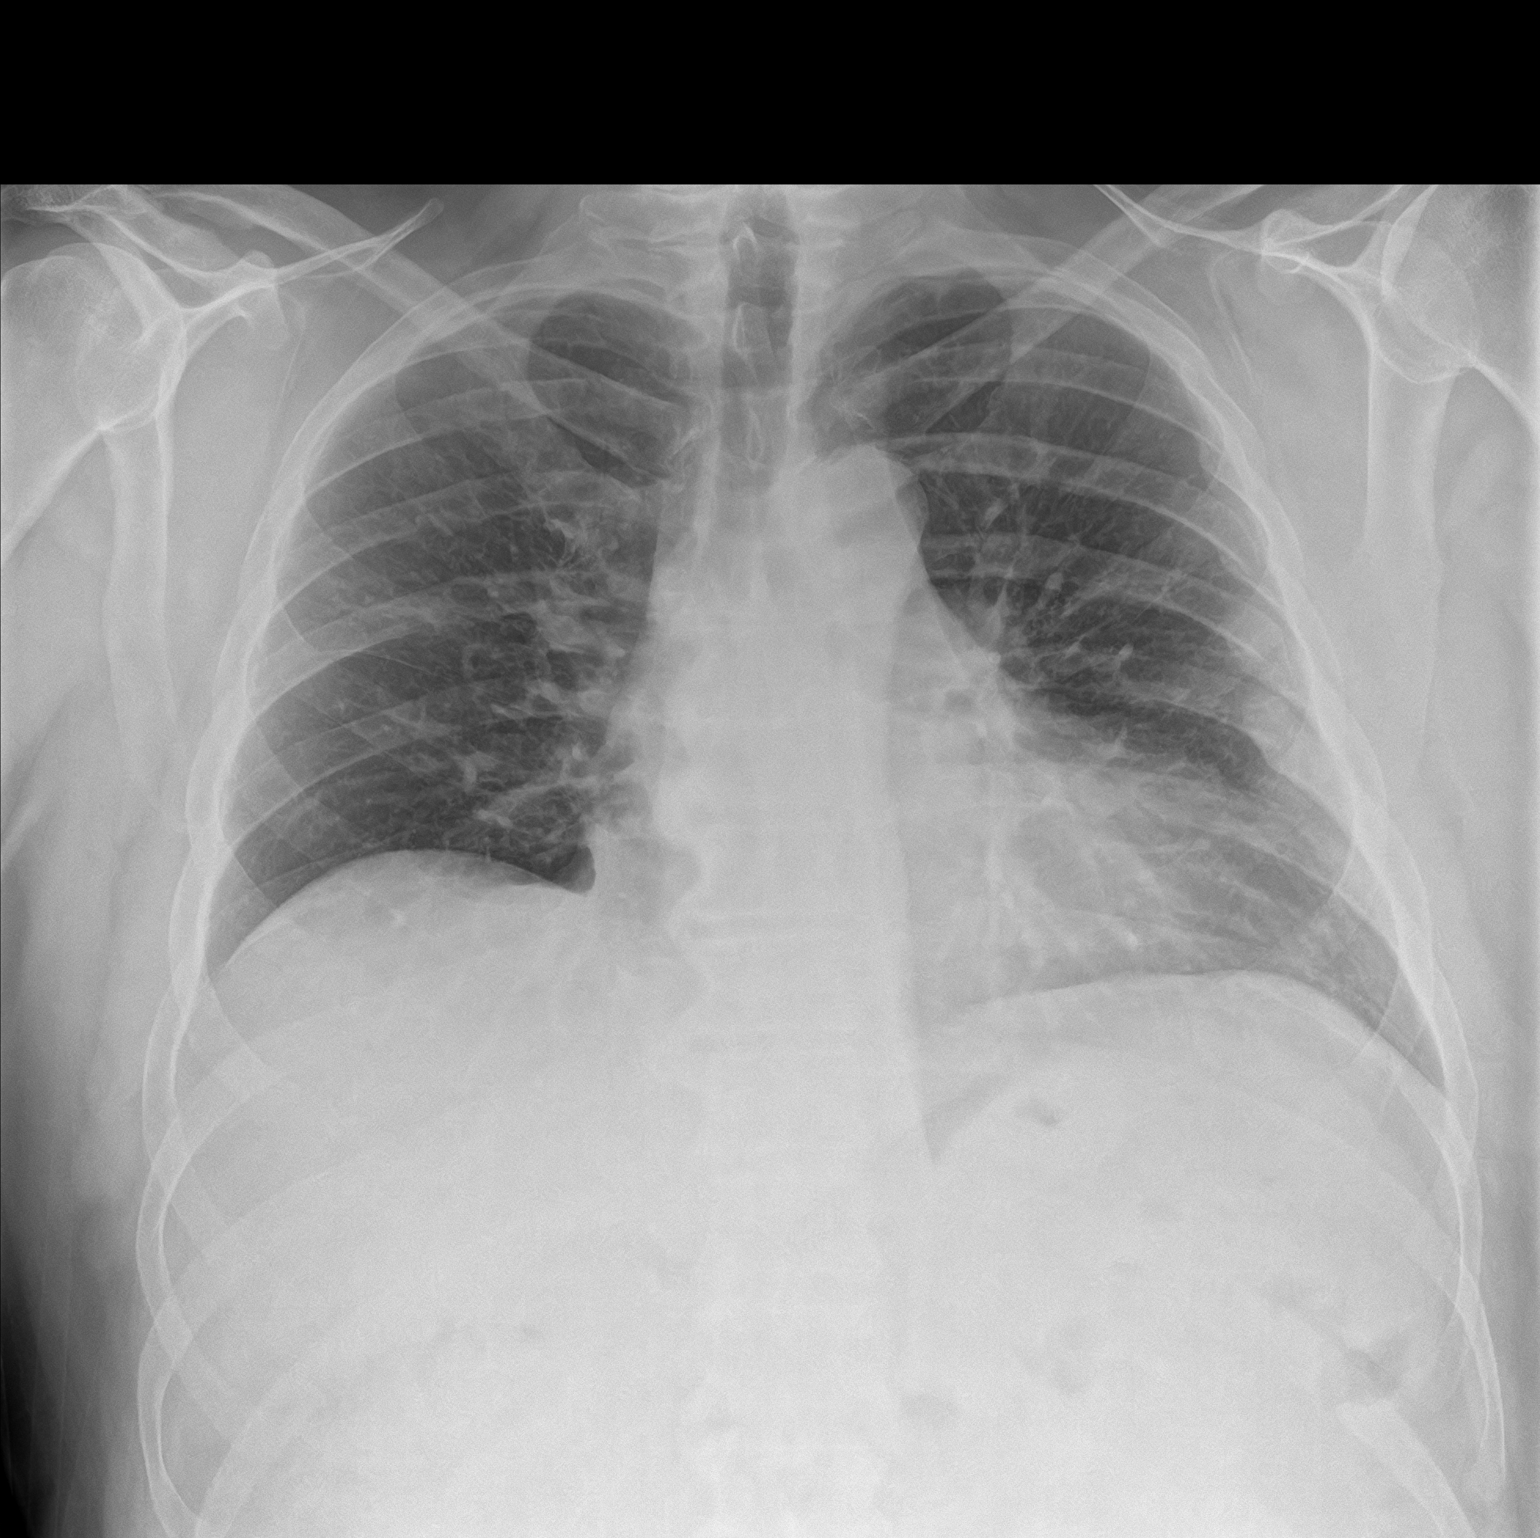
[im 2/2]
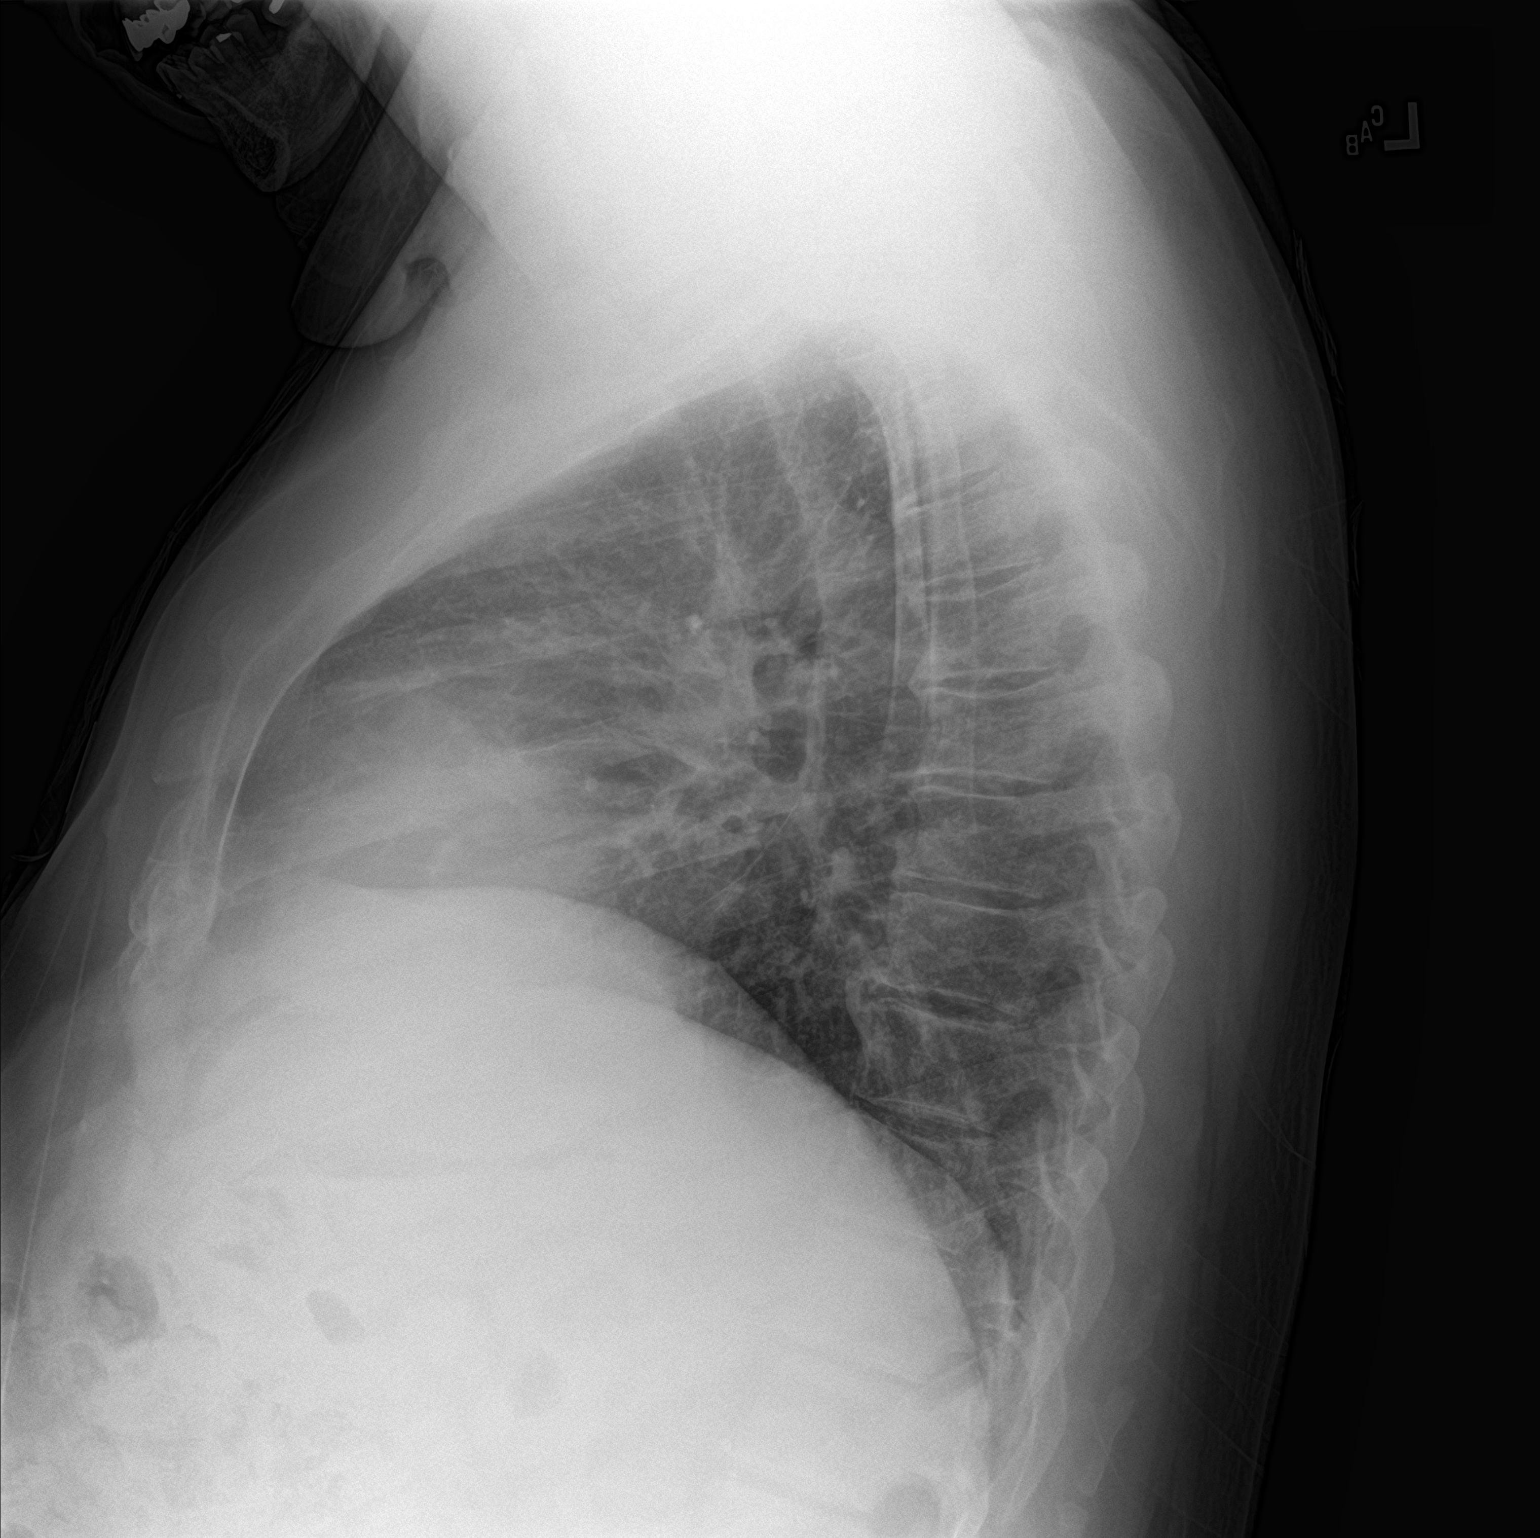

[2 of 2 positions shown; findings below may reference images not displayed]

FINDINGS: Low volume chest with interstitial crowding. There is no edema,
consolidation, effusion, or pneumothorax. Remote lateral left rib
fractures. Borderline heart size for technique. Negative aortic and
hilar contours.
IMPRESSION: Low volume chest without acute finding.

## 2018-12-18 ENCOUNTER — Other Ambulatory Visit: Payer: Self-pay

## 2018-12-18 DIAGNOSIS — Z20822 Contact with and (suspected) exposure to covid-19: Secondary | ICD-10-CM

## 2018-12-20 LAB — NOVEL CORONAVIRUS, NAA: SARS-CoV-2, NAA: NOT DETECTED

## 2018-12-21 ENCOUNTER — Other Ambulatory Visit
Admission: RE | Admit: 2018-12-21 | Discharge: 2018-12-21 | Disposition: A | Discharge status: Home / Self Care | Payer: Medicare Other | Source: Ambulatory Visit | Attending: Internal Medicine | Admitting: Internal Medicine

## 2018-12-21 DIAGNOSIS — Z01818 Encounter for other preprocedural examination: Secondary | ICD-10-CM | POA: Insufficient documentation

## 2018-12-21 DIAGNOSIS — I208 Other forms of angina pectoris: Secondary | ICD-10-CM | POA: Diagnosis not present

## 2018-12-21 LAB — BRAIN NATRIURETIC PEPTIDE: B Natriuretic Peptide: 43 pg/mL (ref 0.0–100.0)

## 2018-12-25 ENCOUNTER — Other Ambulatory Visit: Admission: RE | Admit: 2018-12-25 | Payer: Medicare Other | Source: Ambulatory Visit

## 2018-12-26 ENCOUNTER — Other Ambulatory Visit: Payer: Self-pay

## 2018-12-26 ENCOUNTER — Other Ambulatory Visit
Admission: RE | Admit: 2018-12-26 | Discharge: 2018-12-26 | Disposition: A | Discharge status: Home / Self Care | Payer: Medicare Other | Source: Ambulatory Visit | Attending: Internal Medicine | Admitting: Internal Medicine

## 2018-12-26 DIAGNOSIS — Z01812 Encounter for preprocedural laboratory examination: Secondary | ICD-10-CM | POA: Diagnosis present

## 2018-12-26 DIAGNOSIS — Z20828 Contact with and (suspected) exposure to other viral communicable diseases: Secondary | ICD-10-CM | POA: Insufficient documentation

## 2018-12-26 LAB — SARS CORONAVIRUS 2 (TAT 6-24 HRS): SARS Coronavirus 2: NEGATIVE

## 2018-12-28 ENCOUNTER — Ambulatory Visit: Admission: RE | Admit: 2018-12-28 | Payer: Medicare Other | Source: Home / Self Care | Admitting: Internal Medicine

## 2018-12-28 ENCOUNTER — Encounter: Admission: RE | Payer: Self-pay | Source: Home / Self Care

## 2018-12-28 SURGERY — COLONOSCOPY WITH PROPOFOL
Anesthesia: General

## 2018-12-29 ENCOUNTER — Encounter
Admission: RE | Disposition: A | Discharge status: Home / Self Care | Payer: Self-pay | Source: Home / Self Care | Attending: Internal Medicine

## 2018-12-29 ENCOUNTER — Ambulatory Visit
Admission: RE | Admit: 2018-12-29 | Discharge: 2018-12-29 | Disposition: A | Discharge status: Home / Self Care | Payer: Medicare Other | Attending: Internal Medicine | Admitting: Internal Medicine

## 2018-12-29 ENCOUNTER — Encounter: Payer: Self-pay | Admitting: Internal Medicine

## 2018-12-29 ENCOUNTER — Other Ambulatory Visit: Payer: Self-pay

## 2018-12-29 DIAGNOSIS — I25110 Atherosclerotic heart disease of native coronary artery with unstable angina pectoris: Secondary | ICD-10-CM | POA: Diagnosis not present

## 2018-12-29 DIAGNOSIS — E669 Obesity, unspecified: Secondary | ICD-10-CM | POA: Diagnosis not present

## 2018-12-29 DIAGNOSIS — Z79899 Other long term (current) drug therapy: Secondary | ICD-10-CM | POA: Diagnosis not present

## 2018-12-29 DIAGNOSIS — Z7982 Long term (current) use of aspirin: Secondary | ICD-10-CM | POA: Diagnosis not present

## 2018-12-29 DIAGNOSIS — I10 Essential (primary) hypertension: Secondary | ICD-10-CM | POA: Insufficient documentation

## 2018-12-29 DIAGNOSIS — R001 Bradycardia, unspecified: Secondary | ICD-10-CM | POA: Insufficient documentation

## 2018-12-29 DIAGNOSIS — R5383 Other fatigue: Secondary | ICD-10-CM | POA: Insufficient documentation

## 2018-12-29 DIAGNOSIS — G4733 Obstructive sleep apnea (adult) (pediatric): Secondary | ICD-10-CM | POA: Insufficient documentation

## 2018-12-29 DIAGNOSIS — E785 Hyperlipidemia, unspecified: Secondary | ICD-10-CM | POA: Insufficient documentation

## 2018-12-29 DIAGNOSIS — Z7902 Long term (current) use of antithrombotics/antiplatelets: Secondary | ICD-10-CM | POA: Insufficient documentation

## 2018-12-29 DIAGNOSIS — Z6835 Body mass index (BMI) 35.0-35.9, adult: Secondary | ICD-10-CM | POA: Insufficient documentation

## 2018-12-29 DIAGNOSIS — I200 Unstable angina: Secondary | ICD-10-CM

## 2018-12-29 DIAGNOSIS — F419 Anxiety disorder, unspecified: Secondary | ICD-10-CM | POA: Diagnosis not present

## 2018-12-29 HISTORY — PX: LEFT HEART CATH AND CORONARY ANGIOGRAPHY: CATH118249

## 2018-12-29 SURGERY — LEFT HEART CATH AND CORONARY ANGIOGRAPHY
Anesthesia: Moderate Sedation | Laterality: Left

## 2018-12-29 MED ORDER — CLOPIDOGREL BISULFATE 75 MG PO TABS
ORAL_TABLET | ORAL | Status: DC | PRN
  Administered 2018-12-29: 75 mg via ORAL

## 2018-12-29 MED ORDER — ASPIRIN 81 MG PO CHEW
81.0000 mg | CHEWABLE_TABLET | ORAL | Status: AC
  Administered 2018-12-29: 08:00:00 81 mg via ORAL

## 2018-12-29 MED ORDER — CLOPIDOGREL BISULFATE 75 MG PO TABS: 75.0000 mg | ORAL_TABLET | Freq: Every day | ORAL | Status: DC

## 2018-12-29 MED ORDER — SODIUM CHLORIDE 0.9 % WEIGHT BASED INFUSION: 1.0000 mL/kg/h | INTRAVENOUS | Status: DC

## 2018-12-29 MED ORDER — SODIUM CHLORIDE 0.9% FLUSH: 3.0000 mL | Freq: Two times a day (BID) | INTRAVENOUS | Status: DC

## 2018-12-29 MED ORDER — IOHEXOL 300 MG/ML  SOLN
INTRAMUSCULAR | Status: DC | PRN
  Administered 2018-12-29: 09:00:00 70 mL

## 2018-12-29 MED ORDER — CLOPIDOGREL BISULFATE 75 MG PO TABS
ORAL_TABLET | ORAL | Status: AC
  Filled 2018-12-29: qty 1

## 2018-12-29 MED ORDER — VERAPAMIL HCL 2.5 MG/ML IV SOLN
INTRAVENOUS | Status: AC
  Filled 2018-12-29: qty 2

## 2018-12-29 MED ORDER — ONDANSETRON HCL 4 MG/2ML IJ SOLN: 4.0000 mg | Freq: Four times a day (QID) | INTRAMUSCULAR | Status: DC | PRN

## 2018-12-29 MED ORDER — SODIUM CHLORIDE 0.9% FLUSH: 3.0000 mL | INTRAVENOUS | Status: DC | PRN

## 2018-12-29 MED ORDER — SODIUM CHLORIDE 0.9 % IV SOLN: 250.0000 mL | INTRAVENOUS | Status: DC | PRN

## 2018-12-29 MED ORDER — LABETALOL HCL 5 MG/ML IV SOLN: 10.0000 mg | INTRAVENOUS | Status: DC | PRN

## 2018-12-29 MED ORDER — FENTANYL CITRATE (PF) 100 MCG/2ML IJ SOLN
INTRAMUSCULAR | Status: AC
  Filled 2018-12-29: qty 2

## 2018-12-29 MED ORDER — HYDRALAZINE HCL 20 MG/ML IJ SOLN: 10.0000 mg | INTRAMUSCULAR | Status: DC | PRN

## 2018-12-29 MED ORDER — HEPARIN (PORCINE) IN NACL 1000-0.9 UT/500ML-% IV SOLN
INTRAVENOUS | Status: AC
  Filled 2018-12-29: qty 1000

## 2018-12-29 MED ORDER — MIDAZOLAM HCL 2 MG/2ML IJ SOLN
INTRAMUSCULAR | Status: AC
  Filled 2018-12-29: qty 2

## 2018-12-29 MED ORDER — HEPARIN SODIUM (PORCINE) 1000 UNIT/ML IJ SOLN
INTRAMUSCULAR | Status: AC
  Filled 2018-12-29: qty 1

## 2018-12-29 MED ORDER — ASPIRIN 81 MG PO CHEW
CHEWABLE_TABLET | ORAL | Status: AC
  Filled 2018-12-29: qty 1

## 2018-12-29 MED ORDER — MIDAZOLAM HCL 2 MG/2ML IJ SOLN
INTRAMUSCULAR | Status: DC | PRN
  Administered 2018-12-29: 1 mg via INTRAVENOUS

## 2018-12-29 MED ORDER — HEPARIN SODIUM (PORCINE) 1000 UNIT/ML IJ SOLN
INTRAMUSCULAR | Status: DC | PRN
  Administered 2018-12-29: 5000 [IU] via INTRAVENOUS

## 2018-12-29 MED ORDER — FENTANYL CITRATE (PF) 100 MCG/2ML IJ SOLN
INTRAMUSCULAR | Status: DC | PRN
  Administered 2018-12-29: 25 ug via INTRAVENOUS

## 2018-12-29 MED ORDER — SODIUM CHLORIDE 0.9 % WEIGHT BASED INFUSION
3.0000 mL/kg/h | INTRAVENOUS | Status: AC
  Administered 2018-12-29: 3 mL/kg/h via INTRAVENOUS

## 2018-12-29 MED ORDER — HEPARIN (PORCINE) IN NACL 1000-0.9 UT/500ML-% IV SOLN
INTRAVENOUS | Status: DC | PRN
  Administered 2018-12-29: 500 mL

## 2018-12-29 MED ORDER — VERAPAMIL HCL 2.5 MG/ML IV SOLN
INTRAVENOUS | Status: DC | PRN
  Administered 2018-12-29: 2.5 mg via INTRA_ARTERIAL

## 2018-12-29 MED ORDER — ACETAMINOPHEN 325 MG PO TABS: 650.0000 mg | ORAL_TABLET | ORAL | Status: DC | PRN

## 2018-12-29 SURGICAL SUPPLY — 11 items
CATH INFINITI 5 FR JL3.5 (CATHETERS) ×2 IMPLANT
CATH INFINITI JR4 5F (CATHETERS) ×2 IMPLANT
DEVICE RAD TR BAND REGULAR (VASCULAR PRODUCTS) ×2 IMPLANT
GLIDESHEATH SLEND SS 6F .021 (SHEATH) ×2 IMPLANT
KIT MANI 3VAL PERCEP (MISCELLANEOUS) ×3 IMPLANT
NDL PERC 18GX7CM (NEEDLE) IMPLANT
NEEDLE PERC 18GX7CM (NEEDLE) IMPLANT
PACK CARDIAC CATH (CUSTOM PROCEDURE TRAY) ×3 IMPLANT
SHEATH AVANTI 6FR X 11CM (SHEATH) IMPLANT
WIRE GUIDERIGHT .035X150 (WIRE) IMPLANT
WIRE ROSEN-J .035X260CM (WIRE) ×2 IMPLANT

## 2019-05-02 ENCOUNTER — Encounter: Payer: Self-pay | Admitting: Emergency Medicine

## 2019-05-02 ENCOUNTER — Other Ambulatory Visit: Payer: Self-pay

## 2019-05-02 ENCOUNTER — Ambulatory Visit
Admission: EM | Admit: 2019-05-02 | Discharge: 2019-05-02 | Disposition: A | Discharge status: Home / Self Care | Payer: Medicare Other | Attending: Emergency Medicine | Admitting: Emergency Medicine

## 2019-05-02 DIAGNOSIS — Z23 Encounter for immunization: Secondary | ICD-10-CM | POA: Diagnosis not present

## 2019-05-02 DIAGNOSIS — L089 Local infection of the skin and subcutaneous tissue, unspecified: Secondary | ICD-10-CM

## 2019-05-02 DIAGNOSIS — L03116 Cellulitis of left lower limb: Secondary | ICD-10-CM | POA: Insufficient documentation

## 2019-05-02 DIAGNOSIS — T148XXA Other injury of unspecified body region, initial encounter: Secondary | ICD-10-CM | POA: Diagnosis not present

## 2019-05-02 MED ORDER — AMOXICILLIN-POT CLAVULANATE 875-125 MG PO TABS: 1.0000 | ORAL_TABLET | Freq: Two times a day (BID) | ORAL | 0 refills | Status: AC

## 2019-05-02 MED ORDER — TETANUS-DIPHTH-ACELL PERTUSSIS 5-2.5-18.5 LF-MCG/0.5 IM SUSP
0.5000 mL | Freq: Once | INTRAMUSCULAR | Status: AC
  Administered 2019-05-02: 17:00:00 0.5 mL via INTRAMUSCULAR

## 2019-05-02 MED ORDER — MUPIROCIN 2 % EX OINT: 1.0000 "application " | TOPICAL_OINTMENT | Freq: Two times a day (BID) | CUTANEOUS | 0 refills | Status: DC

## 2019-05-02 MED ORDER — CEFTRIAXONE SODIUM 1 G IJ SOLR
1.0000 g | Freq: Once | INTRAMUSCULAR | Status: AC
  Administered 2019-05-02: 1 g via INTRAMUSCULAR

## 2019-05-02 NOTE — ED Notes (Signed)
Applied none stick dressing and coban to affected area.

## 2019-05-02 NOTE — ED Provider Notes (Signed)
HPI  SUBJECTIVE:  Curtis Clay is a 69 y.o. male who presents with erythema, constant soreness and occasionally sharp tenderness, swelling in his anterior left lower extremity starting 2 days ago.  States he tripped over the edge of an iron stake 6 days ago and thought it was healing well up until 2 days ago.  Reports serous drainage.  No foreign body sensation, fevers, body aches.  No antipyretic in the past 4 to 6 hours.  He washed it out thoroughly with soap and water and has been applying sterile bandages to it.  No alleviating factors.  Symptoms are worse with walking.  He has a past medical history of MI status post stent and is on Plavix.  He has a history of hypertension, prediabetes, coronary artery disease, scrotal cellulitis that required hospitalization.  Not sure if he has ever had MRSA.  No history of neuropathy, PVD, PAD, orthopedic hardware, prosthetic heart valves.  Tetanus is not up-to-date.  NIO:[email or web address], Clydie Braun, MD   Past Medical History:  Diagnosis Date  . Hypertension   . Myocardial infarction [facility])     Past Surgical History:  Procedure Laterality Date  . CARDIAC SURGERY    . CORONARY STENT INTERVENTION N/A 08/20/2016   Procedure: CORONARY STENT INTERVENTION;  Surgeon: Alwyn Pea, MD;  Location: [facility] INVASIVE CV LAB;  Service: Cardiovascular;  Laterality: N/A;  . LEFT HEART CATH AND CORONARY ANGIOGRAPHY N/A 08/20/2016   Procedure: LEFT HEART CATH AND CORONARY ANGIOGRAPHY;  Surgeon: Dalia Heading, MD;  Location: [facility] INVASIVE CV LAB;  Service: Cardiovascular;  Laterality: N/A;  . LEFT HEART CATH AND CORONARY ANGIOGRAPHY Left 12/29/2018   Procedure: LEFT HEART CATH AND CORONARY ANGIOGRAPHY;  Surgeon: Alwyn Pea, MD;  Location: [facility] INVASIVE CV LAB;  Service: Cardiovascular;  Laterality: Left;    Family History  Problem Relation Age of Onset  . Vascular Disease Mother     Social History   Tobacco Use  . Smoking status: Never Smoker  . Smokeless  tobacco: Never Used  Substance Use Topics  . Alcohol use: No  . Drug use: No     Current Facility-Administered Medications:  .  cefTRIAXone (ROCEPHIN) injection 1 g, 1 g, Intramuscular, Once, Domenick Gong, MD  Current Outpatient Medications:  .  aspirin EC 81 MG tablet, Take 81 mg by mouth daily., Disp: , Rfl:  .  clopidogrel (PLAVIX) 75 MG tablet, Take 1 tablet (75 mg total) by mouth daily with breakfast., Disp: 30 tablet, Rfl: 1 .  enalapril (VASOTEC) 5 MG tablet, Take 5 mg by mouth daily., Disp: , Rfl:  .  isosorbide mononitrate (IMDUR) 120 MG 24 hr tablet, Take 120 mg by mouth daily. , Disp: , Rfl:  .  metoprolol tartrate (LOPRESSOR) 25 MG tablet, Take 25 mg by mouth daily., Disp: , Rfl:  .  rosuvastatin (CRESTOR) 20 MG tablet, Take 20 mg by mouth at bedtime., Disp: , Rfl:  .  amoxicillin-clavulanate (AUGMENTIN) 875-125 MG tablet, Take 1 tablet by mouth 2 (two) times daily for 7 days., Disp: 14 tablet, Rfl: 0 .  atorvastatin (LIPITOR) 10 MG tablet, Take 1 tablet (10 mg total) by mouth daily at 6 PM. (Patient not taking: Reported on 12/22/2018), Disp: 30 tablet, Rfl: 1 .  mupirocin ointment (BACTROBAN) 2 %, Apply 1 application topically 2 (two) times daily., Disp: 22 g, Rfl: 0  No Known Allergies   ROS  As noted in HPI.   Physical Exam  BP (!) 161/73 (BP Location: Right  Arm)   Pulse 75   Temp 98.2 F (36.8 C) (Oral)   Resp 18   Ht 5\' 10"  (1.778 m)   Wt 113.4 kg   SpO2 98%   BMI 35.87 kg/m   Constitutional: Well developed, well nourished, no acute distress Eyes:  EOMI, conjunctiva normal bilaterally HENT: Normocephalic, atraumatic,mucus membranes moist Respiratory: Normal inspiratory effort Cardiovascular: Normal rate GI: nondistended Skin: Tender area of erythema measuring 11 x 6.5 cm.  Multiple lacerations measuring 2 x 1 cm, 3 x 1 cm, 3 x 1 cm.  Positive surrounding swelling.  Spontaneously draining serosanguineous fluid.  Positive expressible purulent  drainage.      Musculoskeletal: no deformities Neurologic: Alert & oriented x 3, no focal neuro deficits Psychiatric: Speech and behavior appropriate   ED Course   Medications  cefTRIAXone (ROCEPHIN) injection 1 g (has no administration in time range)  Tdap (BOOSTRIX) injection 0.5 mL (0.5 mLs Intramuscular Given 05/02/19 1703)    Orders Placed This Encounter  Procedures  . Aerobic Culture (superficial specimen)    Standing Status:   Standing    Number of Occurrences:   1  . Apply dry sterile dressing    Standing Status:   Standing    Number of Occurrences:   1    No results found for this or any previous visit (from the past 24 hour(s)). No results found.  ED Clinical Impression  1. Infected laceration   2. Cellulitis of left lower extremity      ED Assessment/Plan  Updating tetanus.  Patient with infected lacerations/cellulitis.  Marked area of erythema with a marker for reference.  Will give him a gram of Rocephin, send home with Bactroban,, wound care, Augmentin 875 mg twice daily for 7 days, Tylenol 1000 mg 3-4 times a day as needed for pain. sending off wound culture to confirm antibiotic choice.  Patient to follow-up with PMD in several days to make sure that he is getting better, to the ER if he gets worse.  Discussed MDM, treatment plan, and plan for follow-up with patient. Discussed sn/sx that should prompt return to the ED. patient agrees with plan.   Meds ordered this encounter  Medications  . Tdap (BOOSTRIX) injection 0.5 mL  . cefTRIAXone (ROCEPHIN) injection 1 g  . amoxicillin-clavulanate (AUGMENTIN) 875-125 MG tablet    Sig: Take 1 tablet by mouth 2 (two) times daily for 7 days.    Dispense:  14 tablet    Refill:  0  . mupirocin ointment (BACTROBAN) 2 %    Sig: Apply 1 application topically 2 (two) times daily.    Dispense:  22 g    Refill:  0    *This clinic note was created using [employer]. Therefore, there may be occasional  mistakes despite careful proofreading.   ?   Melynda Ripple, MD 05/02/19 1743

## 2019-05-02 NOTE — ED Triage Notes (Signed)
Patient c/o hitting his left lower leg on a piece of a metal rod on Friday. He states he thought the area was healing but has started to notice swelling, redness and having some drainage.

## 2019-05-02 NOTE — Discharge Instructions (Addendum)
May take 1 g of Tylenol 3-4 times a day as needed for pain.  Elevate your extremity as much as possible.  Ice or heat, whatever feels better.  Change dressing once a day and use the Bactroban, but  give it some dry time to help encourage it to heal.  Go to the ER for fevers above 100.4, pain not controlled with Tylenol, body aches, if the redness extends significantly beyond the marks that we made, or for any other concerns

## 2019-05-06 LAB — AEROBIC CULTURE W GRAM STAIN (SUPERFICIAL SPECIMEN)

## 2019-07-23 ENCOUNTER — Other Ambulatory Visit: Payer: Self-pay

## 2019-07-23 ENCOUNTER — Other Ambulatory Visit
Admission: RE | Admit: 2019-07-23 | Discharge: 2019-07-23 | Disposition: A | Discharge status: Home / Self Care | Payer: Medicare Other | Source: Ambulatory Visit | Attending: Internal Medicine | Admitting: Internal Medicine

## 2019-07-23 DIAGNOSIS — Z20822 Contact with and (suspected) exposure to covid-19: Secondary | ICD-10-CM | POA: Diagnosis present

## 2019-07-23 LAB — SARS CORONAVIRUS 2 (TAT 6-24 HRS): SARS Coronavirus 2: NEGATIVE

## 2019-07-24 ENCOUNTER — Encounter: Payer: Self-pay | Admitting: Internal Medicine

## 2019-07-25 ENCOUNTER — Ambulatory Visit: Payer: Medicare Other | Admitting: Anesthesiology

## 2019-07-25 ENCOUNTER — Encounter: Payer: Self-pay | Admitting: Internal Medicine

## 2019-07-25 ENCOUNTER — Ambulatory Visit
Admission: RE | Admit: 2019-07-25 | Discharge: 2019-07-25 | Disposition: A | Discharge status: Home / Self Care | Payer: Medicare Other | Attending: Internal Medicine | Admitting: Internal Medicine

## 2019-07-25 ENCOUNTER — Encounter
Admission: RE | Disposition: A | Discharge status: Home / Self Care | Payer: Self-pay | Source: Home / Self Care | Attending: Internal Medicine

## 2019-07-25 ENCOUNTER — Other Ambulatory Visit: Payer: Self-pay

## 2019-07-25 DIAGNOSIS — Z7982 Long term (current) use of aspirin: Secondary | ICD-10-CM | POA: Diagnosis not present

## 2019-07-25 DIAGNOSIS — Z8371 Family history of colonic polyps: Secondary | ICD-10-CM | POA: Diagnosis not present

## 2019-07-25 DIAGNOSIS — Z7902 Long term (current) use of antithrombotics/antiplatelets: Secondary | ICD-10-CM | POA: Diagnosis not present

## 2019-07-25 DIAGNOSIS — Z79899 Other long term (current) drug therapy: Secondary | ICD-10-CM | POA: Diagnosis not present

## 2019-07-25 DIAGNOSIS — I10 Essential (primary) hypertension: Secondary | ICD-10-CM | POA: Insufficient documentation

## 2019-07-25 DIAGNOSIS — G4730 Sleep apnea, unspecified: Secondary | ICD-10-CM | POA: Insufficient documentation

## 2019-07-25 DIAGNOSIS — K5730 Diverticulosis of large intestine without perforation or abscess without bleeding: Secondary | ICD-10-CM | POA: Diagnosis not present

## 2019-07-25 DIAGNOSIS — I2510 Atherosclerotic heart disease of native coronary artery without angina pectoris: Secondary | ICD-10-CM | POA: Insufficient documentation

## 2019-07-25 DIAGNOSIS — Z1211 Encounter for screening for malignant neoplasm of colon: Secondary | ICD-10-CM | POA: Diagnosis not present

## 2019-07-25 DIAGNOSIS — I252 Old myocardial infarction: Secondary | ICD-10-CM | POA: Insufficient documentation

## 2019-07-25 DIAGNOSIS — K635 Polyp of colon: Secondary | ICD-10-CM | POA: Insufficient documentation

## 2019-07-25 HISTORY — DX: Dyspnea, unspecified: R06.00

## 2019-07-25 HISTORY — DX: Sleep apnea, unspecified: G47.30

## 2019-07-25 HISTORY — DX: Atherosclerotic heart disease of native coronary artery without angina pectoris: I25.10

## 2019-07-25 HISTORY — PX: COLONOSCOPY WITH PROPOFOL: SHX5780

## 2019-07-25 SURGERY — COLONOSCOPY WITH PROPOFOL
Anesthesia: General

## 2019-07-25 MED ORDER — PROPOFOL 500 MG/50ML IV EMUL
INTRAVENOUS | Status: DC | PRN
  Administered 2019-07-25: 150 ug/kg/min via INTRAVENOUS

## 2019-07-25 MED ORDER — SODIUM CHLORIDE 0.9 % IV SOLN
INTRAVENOUS | Status: DC
  Administered 2019-07-25: 1000 mL via INTRAVENOUS

## 2019-07-25 MED ORDER — PROPOFOL 500 MG/50ML IV EMUL
INTRAVENOUS | Status: AC
  Filled 2019-07-25: qty 50

## 2019-07-25 NOTE — Op Note (Signed)
[facility] Gastroenterology Patient Name: Curtis Clay Procedure Date: 07/25/2019 12:37 PM MRN: [id number] Account #: [id number] Date of Birth: 1950-04-07 Admit Type: Outpatient Age: 69 Room: [facility] ENDO ROOM 3 Gender: Male Note Status: Finalized Procedure:             Colonoscopy Indications:           Colon cancer screening in patient at increased risk:                         Family history of 1st-degree relative with colon polyps Providers:             Boykin Nearing. Norma Fredrickson MD, MD Referring MD:          Daniel Nones, MD (Referring MD) Medicines:             Propofol per Anesthesia Complications:         No immediate complications. Procedure:             Pre-Anesthesia Assessment:                        - The risks and benefits of the procedure and the                         sedation options and risks were discussed with the                         patient. All questions were answered and informed                         consent was obtained.                        - Patient identification and proposed procedure were                         verified prior to the procedure by the nurse. The                         procedure was verified in the procedure room.                        - ASA Grade Assessment: III - A patient with severe                         systemic disease.                        - After reviewing the risks and benefits, the patient                         was deemed in satisfactory condition to undergo the                         procedure.                        After obtaining informed consent, the colonoscope was  passed under direct vision. Throughout the procedure,                         the patient's blood pressure, pulse, and oxygen                         saturations were monitored continuously. The                         Colonoscope was introduced through the anus and                         advanced to the  the cecum, identified by appendiceal                         orifice and ileocecal valve. The colonoscopy was                         performed without difficulty. The patient tolerated                         the procedure well. The quality of the bowel                         preparation was good. The ileocecal valve, appendiceal                         orifice, and rectum were photographed. Findings:      Multiple small and large-mouthed diverticula were found in the sigmoid       colon.      Non-bleeding internal hemorrhoids were found during retroflexion. The       hemorrhoids were Grade I (internal hemorrhoids that do not prolapse).      A 5 mm polyp was found in the sigmoid colon. The polyp was sessile. The       polyp was removed with a jumbo cold forceps. Resection and retrieval       were complete.      The perianal exam findings include internal hemorrhoids that prolapse       with straining, but require manual replacement into the anal canal       (Grade III).      The digital rectal exam was normal. Pertinent negatives include normal       sphincter tone.      The exam was otherwise without abnormality. Impression:            - Diverticulosis in the sigmoid colon.                        - Non-bleeding internal hemorrhoids.                        - One 5 mm polyp in the sigmoid colon, removed with a                         jumbo cold forceps. Resected and retrieved. Recommendation:        - Patient has a contact number available for  emergencies. The signs and symptoms of potential                         delayed complications were discussed with the patient.                         Return to normal activities tomorrow. Written                         discharge instructions were provided to the patient.                        - Resume previous diet.                        - Continue present medications.                        - Await pathology  results.                        - Repeat colonoscopy in 5 years for screening purposes.                        - Return to GI office PRN.                        - The findings and recommendations were discussed with                         the patient. Procedure Code(s):     --- Professional ---                        [phone], Colonoscopy, flexible; with biopsy, single or                         multiple Diagnosis Code(s):     --- Professional ---                        K57.30, Diverticulosis of large intestine without                         perforation or abscess without bleeding                        K63.5, Polyp of colon                        K64.0, First degree hemorrhoids                        Z83.71, Family history of colonic polyps CPT copyright 2019 American [employer]. All rights reserved. The codes documented in this report are preliminary and upon coder review may  be revised to meet current compliance requirements. Stanton Kidney MD, MD 07/25/2019 2:29:52 PM This report has been signed electronically. Number of Addenda: 0 Note Initiated On: 07/25/2019 12:37 PM Scope Withdrawal Time: 0 hours 7 minutes 43 seconds  Total Procedure Duration: 0 hours 13 minutes 33 seconds  Estimated Blood Loss:  Estimated blood loss: none. Estimated blood loss:  none. Estimated blood loss: none.      [facility]

## 2019-07-25 NOTE — Anesthesia Preprocedure Evaluation (Signed)
Anesthesia Evaluation  Patient identified by MRN, date of birth, ID band Patient awake    Reviewed: Allergy & Precautions, H&P , NPO status , Patient's Chart, lab work & pertinent test results, reviewed documented beta blocker date and time   History of Anesthesia Complications Negative for: history of anesthetic complications  Airway Mallampati: III  TM Distance: >3 FB Neck ROM: full  Mouth opening: Limited Mouth Opening  Dental  (+) Dental Advidsory Given, Missing   Pulmonary shortness of breath and with exertion, sleep apnea , neg COPD, neg recent URI,    Pulmonary exam normal breath sounds clear to auscultation       Cardiovascular Exercise Tolerance: Good hypertension, (-) angina+ CAD, + Past MI and + Cardiac Stents  (-) CABG Normal cardiovascular exam(-) dysrhythmias (-) Valvular Problems/Murmurs Rhythm:regular Rate:Normal     Neuro/Psych negative neurological ROS  negative psych ROS   GI/Hepatic negative GI ROS, Neg liver ROS,   Endo/Other  negative endocrine ROS  Renal/GU negative Renal ROS  negative genitourinary   Musculoskeletal   Abdominal   Peds  Hematology negative hematology ROS (+)   Anesthesia Other Findings Past Medical History: No date: Coronary artery disease No date: Dyspnea No date: Hypertension No date: Myocardial infarction (HCC) No date: Sleep apnea   Reproductive/Obstetrics negative OB ROS                             Anesthesia Physical Anesthesia Plan  ASA: III  Anesthesia Plan: General   Post-op Pain Management:    Induction: Intravenous  PONV Risk Score and Plan: 2 and Propofol infusion and TIVA  Airway Management Planned: Natural Airway and Nasal Cannula  Additional Equipment:   Intra-op Plan:   Post-operative Plan:   Informed Consent: I have reviewed the patients History and Physical, chart, labs and discussed the procedure including the  risks, benefits and alternatives for the proposed anesthesia with the patient or authorized representative who has indicated his/her understanding and acceptance.     Dental Advisory Given  Plan Discussed with: Anesthesiologist, CRNA and Surgeon  Anesthesia Plan Comments:         Anesthesia Quick Evaluation

## 2019-07-25 NOTE — Interval H&P Note (Signed)
History and Physical Interval Note:  07/25/2019 1:26 PM  Curtis Clay  has presented today for surgery, with the diagnosis of fhx colon polyps.  The various methods of treatment have been discussed with the patient and family. After consideration of risks, benefits and other options for treatment, the patient has consented to  Procedure(s): COLONOSCOPY WITH PROPOFOL (N/A) as a surgical intervention.  The patient's history has been reviewed, patient examined, no change in status, stable for surgery.  I have reviewed the patient's chart and labs.  Questions were answered to the patient's satisfaction.     Desha, [place]

## 2019-07-25 NOTE — Anesthesia Procedure Notes (Signed)
Performed by: Cook-Martin, Bodin Gorka Pre-anesthesia Checklist: Patient identified, Emergency Drugs available, Suction available, Patient being monitored and Timeout performed Patient Re-evaluated:Patient Re-evaluated prior to induction Oxygen Delivery Method: Nasal cannula Preoxygenation: Pre-oxygenation with 100% oxygen Induction Type: IV induction Placement Confirmation: positive ETCO2 and CO2 detector       

## 2019-07-25 NOTE — Transfer of Care (Signed)
Immediate Anesthesia Transfer of Care Note  Patient: Curtis Clay  Procedure(s) Performed: COLONOSCOPY WITH PROPOFOL (N/A )  Patient Location: PACU  Anesthesia Type:General  Level of Consciousness: awake and sedated  Airway & Oxygen Therapy: Patient Spontanous Breathing and Patient connected to nasal cannula oxygen  Post-op Assessment: Report given to RN and Post -op Vital signs reviewed and stable  Post vital signs: Reviewed  Last Vitals:  Vitals Value Taken Time  BP    Temp    Pulse    Resp    SpO2      Last Pain:  Vitals:   07/25/19 1336  TempSrc: Tympanic  PainSc: 0-No pain         Complications: No complications documented.

## 2019-07-25 NOTE — H&P (Signed)
Outpatient short stay form Pre-procedure 07/25/2019 1:24 PM Curtis Clay K. Norma Fredrickson, M.D.  Primary Physician: Orene Desanctis, M.D.  Reason for visit: Family history of colon polyps (sister).  History of present illness:   69 year old patient presenting for family history of colon polyps (sister). Patient denies any change in bowel habits, rectal bleeding or involuntary weight loss. Patient takes Plavix but has not taken it for at least 5 days prior to today's procedure.       Current Facility-Administered Medications:  .  0.9 %  sodium chloride infusion, , Intravenous, Continuous, Tephanie Escorcia, Boykin Nearing, MD  Medications Prior to Admission  Medication Sig Dispense Refill Last Dose  . aspirin EC 81 MG tablet Take 81 mg by mouth daily.     Marland Kitchen atorvastatin (LIPITOR) 10 MG tablet Take 1 tablet (10 mg total) by mouth daily at 6 PM. (Patient not taking: Reported on 12/22/2018) 30 tablet 1   . clopidogrel (PLAVIX) 75 MG tablet Take 1 tablet (75 mg total) by mouth daily with breakfast. 30 tablet 1   . enalapril (VASOTEC) 5 MG tablet Take 5 mg by mouth daily.     . isosorbide mononitrate (IMDUR) 120 MG 24 hr tablet Take 120 mg by mouth daily.      . metoprolol tartrate (LOPRESSOR) 25 MG tablet Take 25 mg by mouth daily.     . mupirocin ointment (BACTROBAN) 2 % Apply 1 application topically 2 (two) times daily. 22 g 0   . rosuvastatin (CRESTOR) 20 MG tablet Take 20 mg by mouth at bedtime.        No Known Allergies   Past Medical History:  Diagnosis Date  . Coronary artery disease   . Hypertension   . Myocardial infarction (HCC)   . Sleep apnea     Review of systems:  Otherwise negative.    Physical Exam  Gen: Alert, oriented. Appears stated age.  HEENT: [place]/AT. PERRLA. Lungs: CTA, no wheezes. CV: RR nl S1, S2. Abd: soft, benign, no masses. BS+ Ext: No edema. Pulses 2+    Planned procedures: Proceed with colonoscopy. The patient understands the nature of the planned procedure,  indications, risks, alternatives and potential complications including but not limited to bleeding, infection, perforation, damage to internal organs and possible oversedation/side effects from anesthesia. The patient agrees and gives consent to proceed.  Please refer to procedure notes for findings, recommendations and patient disposition/instructions.     Curtis Clay K. Norma Fredrickson, M.D. Gastroenterology 07/25/2019  1:24 PM

## 2019-07-26 ENCOUNTER — Encounter: Payer: Self-pay | Admitting: Internal Medicine

## 2019-07-26 NOTE — Anesthesia Postprocedure Evaluation (Signed)
Anesthesia Post Note  Patient: Curtis Clay  Procedure(s) Performed: COLONOSCOPY WITH PROPOFOL (N/A )  Patient location during evaluation: Endoscopy Anesthesia Type: General Level of consciousness: awake and alert Pain management: pain level controlled Vital Signs Assessment: post-procedure vital signs reviewed and stable Respiratory status: spontaneous breathing, nonlabored ventilation, respiratory function stable and patient connected to nasal cannula oxygen Cardiovascular status: blood pressure returned to baseline and stable Postop Assessment: no apparent nausea or vomiting Anesthetic complications: no   No complications documented.   Last Vitals:  Vitals:   07/25/19 1439 07/25/19 1449  BP: (!) 110/59 120/76  Pulse: (!) 56 (!) 59  Resp: 18 (!) 26  Temp:    SpO2: 91% 94%    Last Pain:  Vitals:   07/25/19 1449  TempSrc:   PainSc: 0-No pain                 Lenard Simmer

## 2019-07-27 LAB — SURGICAL PATHOLOGY

## 2020-11-18 ENCOUNTER — Encounter: Payer: Self-pay | Admitting: Emergency Medicine

## 2020-11-18 ENCOUNTER — Ambulatory Visit
Admission: EM | Admit: 2020-11-18 | Discharge: 2020-11-18 | Disposition: A | Discharge status: Home / Self Care | Payer: Medicare Other | Attending: Internal Medicine | Admitting: Internal Medicine

## 2020-11-18 ENCOUNTER — Other Ambulatory Visit: Payer: Self-pay

## 2020-11-18 DIAGNOSIS — J111 Influenza due to unidentified influenza virus with other respiratory manifestations: Secondary | ICD-10-CM | POA: Diagnosis not present

## 2020-11-18 MED ORDER — BENZONATATE 200 MG PO CAPS: 200.0000 mg | ORAL_CAPSULE | Freq: Three times a day (TID) | ORAL | 1 refills | Status: DC | PRN

## 2020-11-18 NOTE — ED Triage Notes (Signed)
Pt c/o cough, nasal congestion, rib pain. Started about 4-5 days ago. Denies fever. He states he took a home covid test and was negative.

## 2020-11-18 NOTE — Discharge Instructions (Addendum)
Symptoms and clinical course seem most consistent today with an influenza-like illness.  Anticipate gradual improvement in cough, congestion and overall well being over the next several days.  Please check back in if new fever, worsening cough/congestion, or worsening well being occur.  I did not appreciate any signs of pneumonia or sinus infection today.  Tessalon (benzonatate) cough perles should help with the bad coughing.  If not helpful enough, would consider a prescription for an albuterol inhaler with spacer to relieve irritated airways.

## 2020-11-18 NOTE — ED Provider Notes (Signed)
[facility]    CSN: [id number] Arrival date & time: 11/18/20  0802      History   Chief Complaint Chief Complaint  Patient presents with   Cough    HPI Curtis Clay is a 70 y.o. male.  He presents today with 4 or 5-day history of malaise, chest and head congestion, and cough that is keeping him awake at night.  Feels terrible.  Wife has a similar syndrome.  No fever.  Took a COVID test last evening that was negative.  Says these respiratory symptoms are hanging on longer than is typical for him.  HPI  Past Medical History:  Diagnosis Date   Coronary artery disease    Dyspnea    Hypertension    Myocardial infarction [facility])    Sleep apnea     Patient Active Problem List   Diagnosis Date Noted   CAD S/P percutaneous coronary angioplasty    Essential hypertension    NSTEMI (non-ST elevated myocardial infarction) ([place]) 08/20/2016    Past Surgical History:  Procedure Laterality Date   CARDIAC SURGERY     CIRCUMCISION, NON-NEWBORN     COLONOSCOPY WITH PROPOFOL N/A 07/25/2019   Procedure: COLONOSCOPY WITH PROPOFOL;  Surgeon: Toledo, Benay Pike, MD;  Location: [facility] ENDOSCOPY;  Service: Gastroenterology;  Laterality: N/A;   CORONARY STENT INTERVENTION N/A 08/20/2016   Procedure: CORONARY STENT INTERVENTION;  Surgeon: Yolonda Kida, MD;  Location: [place] [facility];  Service: Cardiovascular;  Laterality: N/A;   LEFT HEART CATH AND CORONARY ANGIOGRAPHY N/A 08/20/2016   Procedure: LEFT HEART CATH AND CORONARY ANGIOGRAPHY;  Surgeon: Teodoro Spray, MD;  Location: [place] [facility];  Service: Cardiovascular;  Laterality: N/A;   LEFT HEART CATH AND CORONARY ANGIOGRAPHY Left 12/29/2018   Procedure: LEFT HEART CATH AND CORONARY ANGIOGRAPHY;  Surgeon: Yolonda Kida, MD;  Location: [place] [facility];  Service: Cardiovascular;  Laterality: Left;   TONSILLECTOMY         Home Medications    Prior to Admission medications   Medication Sig Start Date End  Date Taking? Authorizing Provider  benzonatate (TESSALON) 200 MG capsule Take 1 capsule (200 mg total) by mouth 3 (three) times daily as needed for cough. 11/18/20  Yes Wynona Luna, MD  clopidogrel (PLAVIX) 75 MG tablet Take 1 tablet (75 mg total) by mouth daily with breakfast. 08/22/16  Yes Vaughan Basta, MD  enalapril (VASOTEC) 5 MG tablet Take 5 mg by mouth daily.   Yes [provider]  isosorbide mononitrate (IMDUR) 120 MG 24 hr tablet Take 120 mg by mouth daily.  10/23/18  Yes [provider]  metoprolol tartrate (LOPRESSOR) 25 MG tablet Take 25 mg by mouth daily.   Yes [provider]  rosuvastatin (CRESTOR) 20 MG tablet Take 20 mg by mouth at bedtime. 12/21/18  Yes [provider]  aspirin EC 81 MG tablet Take 81 mg by mouth daily.    [provider]  mupirocin ointment (BACTROBAN) 2 % Apply 1 application topically 2 (two) times daily. 05/02/19   Melynda Ripple, MD    Family History Family History  Problem Relation Age of Onset   Vascular Disease Mother     Social History Social History   Tobacco Use   Smoking status: Never   Smokeless tobacco: Never  Vaping Use   Vaping Use: Never used  Substance Use Topics   Alcohol use: No   Drug use: No     Allergies  Patient has no known allergies.   Review of Systems Review of Systems Minimal sore throat Malaise Some myalgias Wet cough Runny nose, nose feels a little raw No fever Does not report any GI symptoms   Physical Exam Triage Vital Signs ED Triage Vitals  Enc Vitals Group     BP 11/18/20 0822 (!) 121/59     Pulse Rate 11/18/20 0822 (!) 58     Resp 11/18/20 0822 18     Temp 11/18/20 0822 98.7 F (37.1 C)     Temp Source 11/18/20 0822 Oral     SpO2 11/18/20 0822 97 %     Weight 11/18/20 0821 238 lb 15.7 oz (108.4 kg)     Height 11/18/20 0821 5\' 11"  (1.803 m)     Head Circumference --      Peak Flow --      Pain Score 11/18/20 0819 10      Pain Loc --      Pain Edu? --      Excl. in GC? --      Updated Vital Signs BP (!) 121/59 (BP Location: Left Arm)   Pulse (!) 58   Temp 98.7 F (37.1 C) (Oral)   Resp 18   Ht 5\' 11"  (1.803 m)   Wt 108.4 kg   SpO2 97%   BMI 33.33 kg/m     Physical Exam Vitals and nursing note reviewed.  Constitutional:      General: He is not in acute distress.    Appearance: He is ill-appearing. He is not toxic-appearing.     Comments: Alert, nicely groomed  HENT:     Head: Atraumatic.  Eyes:     Conjunctiva/sclera:     Right eye: Right conjunctiva is injected. No exudate.    Left eye: Left conjunctiva is injected. No exudate.    Comments: Conjugate gaze  Cardiovascular:     Rate and Rhythm: Normal rate.  Pulmonary:     Effort: No respiratory distress.     Breath sounds: No wheezing.     Comments: Slightly coarse breath sounds throughout Abdominal:     General: There is no distension.  Musculoskeletal:        General: Normal range of motion.     Cervical back: Neck supple.  Feet:     Comments: Warm, no edema Skin:    General: Skin is warm and dry.     Comments: No cyanosis  Neurological:     Mental Status: He is alert and oriented to person, place, and time.     UC Treatments / Results  Labs (all labs ordered are listed, but only abnormal results are displayed) Labs Reviewed - No data to display no labs indicated EKG   Radiology No results found.  No x-ray indicated    Medications Ordered in UC Medications - No data to display no meds given at urgent care    Final Clinical Impressions(s) / UC Diagnoses   Final diagnoses:  Influenza-like illness     Discharge Instructions      Symptoms and clinical course seem most consistent today with an influenza-like illness.  Anticipate gradual improvement in cough, congestion and overall well being over the next several days.  Please check back in if new fever, worsening cough/congestion, or worsening well being  occur.  I did not appreciate any signs of pneumonia or sinus infection today.  Tessalon (benzonatate) cough perles should help with the bad coughing.  If not helpful enough, would consider  a prescription for an albuterol inhaler with spacer to relieve irritated airways.       ED Prescriptions     Medication Sig Dispense Auth. Provider   benzonatate (TESSALON) 200 MG capsule Take 1 capsule (200 mg total) by mouth 3 (three) times daily as needed for cough. 30 capsule Wynona Luna, MD      PDMP not reviewed this encounter.   Wynona Luna, MD 11/18/20 [phone]

## 2021-05-30 ENCOUNTER — Other Ambulatory Visit: Payer: Self-pay

## 2021-05-30 ENCOUNTER — Encounter: Payer: Self-pay | Admitting: Emergency Medicine

## 2021-05-30 ENCOUNTER — Ambulatory Visit
Admission: EM | Admit: 2021-05-30 | Discharge: 2021-05-30 | Disposition: A | Discharge status: Home / Self Care | Payer: Medicare Other

## 2021-05-30 DIAGNOSIS — N529 Male erectile dysfunction, unspecified: Secondary | ICD-10-CM | POA: Insufficient documentation

## 2021-05-30 DIAGNOSIS — H1033 Unspecified acute conjunctivitis, bilateral: Secondary | ICD-10-CM

## 2021-05-30 DIAGNOSIS — R0609 Other forms of dyspnea: Secondary | ICD-10-CM | POA: Insufficient documentation

## 2021-05-30 DIAGNOSIS — I2510 Atherosclerotic heart disease of native coronary artery without angina pectoris: Secondary | ICD-10-CM | POA: Insufficient documentation

## 2021-05-30 MED ORDER — MOXIFLOXACIN HCL 0.5 % OP SOLN: 1.0000 [drp] | Freq: Three times a day (TID) | OPHTHALMIC | 0 refills | Status: AC

## 2021-05-30 NOTE — Discharge Instructions (Signed)
Instill 1 drop of Vigamox in each eye every 8 hours for the next 7 days for treatment of your conjunctivitis.  Avoid touching your eyes as much as possible.  Wipe down all surfaces, countertops, and doorknobs after the first and second 24 hours on eyedrops.  Wash her face with a clean wash rag to remove any drainage and use a different portion of the wash rag to clean each eye so as to not reinfect yourself.  Return for reevaluation for any new or worsening symptoms.  

## 2021-05-30 NOTE — ED Triage Notes (Signed)
Patient states that his wife was diagnosed with pink eye today.  Patient c/o redness and drainage in both eyes that started 3 days ago.

## 2021-05-30 NOTE — ED Provider Notes (Signed)
[facility]    CSN: [id number] Arrival date & time: 05/30/21  1419      History   Chief Complaint Chief Complaint  Patient presents with   Eye Drainage    bilateral    HPI JERRARD BRADBURN is a 71 y.o. male.   HPI  71 year old male here for evaluation of eye complaints.  Patient reports that he has been experiencing redness with some mild discharge from both of his eyes for last 3 days.  He states they have started become itchy and also has had some intermittent blurred vision.  His wife has similar symptoms and was diagnosed with conjunctivitis earlier today.  Past Medical History:  Diagnosis Date   Coronary artery disease    Dyspnea    Hypertension    Myocardial infarction [facility])    Sleep apnea     Patient Active Problem List   Diagnosis Date Noted   ED (erectile dysfunction) 05/30/2021   DOE (dyspnea on exertion) 05/30/2021   CAD (coronary artery disease) 05/30/2021   Hyperplastic polyp of ascending colon 07/25/2019   Thrombocytopenia (HCC) 08/16/2018   OSA (obstructive sleep apnea) 10/25/2017   Obesity (BMI 30-39.9) 08/25/2017   CAD S/P percutaneous coronary angioplasty    Angina pectoris (HCC) 08/20/2016   History of non-ST elevation myocardial infarction (NSTEMI) 08/20/2016   Tests ordered 02/08/2014   Benign essential hypertension 08/19/2011   Mixed hyperlipidemia 08/19/2011    Past Surgical History:  Procedure Laterality Date   CARDIAC SURGERY     CIRCUMCISION, NON-NEWBORN     COLONOSCOPY WITH PROPOFOL N/A 07/25/2019   Procedure: COLONOSCOPY WITH PROPOFOL;  Surgeon: Toledo, Boykin Nearing, MD;  Location: [facility] ENDOSCOPY;  Service: Gastroenterology;  Laterality: N/A;   CORONARY STENT INTERVENTION N/A 08/20/2016   Procedure: CORONARY STENT INTERVENTION;  Surgeon: Alwyn Pea, MD;  Location: [facility] INVASIVE CV LAB;  Service: Cardiovascular;  Laterality: N/A;   LEFT HEART CATH AND CORONARY ANGIOGRAPHY N/A 08/20/2016   Procedure: LEFT HEART CATH  AND CORONARY ANGIOGRAPHY;  Surgeon: Dalia Heading, MD;  Location: [facility] INVASIVE CV LAB;  Service: Cardiovascular;  Laterality: N/A;   LEFT HEART CATH AND CORONARY ANGIOGRAPHY Left 12/29/2018   Procedure: LEFT HEART CATH AND CORONARY ANGIOGRAPHY;  Surgeon: Alwyn Pea, MD;  Location: [facility] INVASIVE CV LAB;  Service: Cardiovascular;  Laterality: Left;   TONSILLECTOMY         Home Medications    Prior to Admission medications   Medication Sig Start Date End Date Taking? Authorizing Provider  albuterol (VENTOLIN HFA) 108 (90 Base) MCG/ACT inhaler Inhale into the lungs. 03/26/21 03/26/22 Yes [provider]  clopidogrel (PLAVIX) 75 MG tablet Take 1 tablet (75 mg total) by mouth daily with breakfast. 08/22/16  Yes Altamese Dilling, MD  enalapril (VASOTEC) 10 MG tablet 1 tablet (10 mg total) once daily 02/06/21  Yes [provider]  isosorbide mononitrate (IMDUR) 60 MG 24 hr tablet Take 60 mg by mouth daily. 04/15/21  Yes [provider]  metoprolol tartrate (LOPRESSOR) 25 MG tablet Take 25 mg by mouth daily.   Yes [provider]  moxifloxacin (VIGAMOX) 0.5 % ophthalmic solution Place 1 drop into both eyes 3 (three) times daily for 7 days. 05/30/21 06/06/21 Yes Becky Augusta, NP  rosuvastatin (CRESTOR) 20 MG tablet Take 20 mg by mouth at bedtime. 12/21/18  Yes [provider]  sildenafil (VIAGRA) 50 MG tablet Take by mouth. 07/16/20  Yes [provider]    Family History Family History  Problem Relation Age of Onset   Vascular Disease Mother     Social History Social History   Tobacco Use   Smoking status: Never   Smokeless tobacco: Never  Vaping Use   Vaping Use: Never used  Substance Use Topics   Alcohol use: No   Drug use: No     Allergies   Patient has no known allergies.   Review of Systems Review of Systems  Eyes:  Positive for discharge, redness, itching and visual disturbance. Negative for photophobia.   Hematological: Negative.   Psychiatric/Behavioral: Negative.      Physical Exam Triage Vital Signs ED Triage Vitals  Enc Vitals Group     BP 05/30/21 1435 115/62     Pulse Rate 05/30/21 1435 (!) 58     Resp 05/30/21 1435 15     Temp 05/30/21 1435 97.7 F (36.5 C)     Temp Source 05/30/21 1435 Oral     SpO2 05/30/21 1435 95 %     Weight 05/30/21 1431 250 lb (113.4 kg)     Height 05/30/21 1431 5\' 11"  (1.803 m)     Head Circumference --      Peak Flow --      Pain Score 05/30/21 1431 0     Pain Loc --      Pain Edu? --      Excl. in GC? --    No data found.  Updated Vital Signs BP 115/62 (BP Location: Left Arm)   Pulse (!) 58   Temp 97.7 F (36.5 C) (Oral)   Resp 15   Ht 5\' 11"  (1.803 m)   Wt 250 lb (113.4 kg)   SpO2 95%   BMI 34.87 kg/m   Visual Acuity Right Eye Distance:   Left Eye Distance:   Bilateral Distance:    Right Eye Near:   Left Eye Near:    Bilateral Near:     Physical Exam Vitals and nursing note reviewed.  Constitutional:      Appearance: Normal appearance. He is not ill-appearing.  HENT:     Head: Normocephalic and atraumatic.  Eyes:     General: No scleral icterus.       Right eye: Discharge present.        Left eye: Discharge present.    Extraocular Movements: Extraocular movements intact.     Pupils: Pupils are equal, round, and reactive to light.  Skin:    General: Skin is warm and dry.     Capillary Refill: Capillary refill takes less than 2 seconds.     Findings: No erythema or rash.  Neurological:     General: No focal deficit present.     Mental Status: He is alert and oriented to person, place, and time.  Psychiatric:        Mood and Affect: Mood normal.        Behavior: Behavior normal.        Thought Content: Thought content normal.        Judgment: Judgment normal.     UC Treatments / Results  Labs (all labs ordered are listed, but only abnormal results are displayed) Labs Reviewed - No data to  display  EKG   Radiology No results found.  Procedures Procedures (including critical care time)  Medications Ordered in UC Medications - No data to display  Initial Impression / Assessment and Plan / UC Course  I have reviewed the triage vital signs and the nursing notes.  Pertinent  labs & imaging results that were available during my care of the patient were reviewed by me and considered in my medical decision making (see chart for details).  Patient is a very pleasant, nontoxic-appearing 71 year old male here for evaluation of eye complaints as outlined in HPI above.  On exam patient's pupils are equal round reactive bilaterally and his EOM is intact.  Both labral and bulbar conjunctiva bilaterally are erythematous and injected.  There is mild mucopurulent discharge in both inner canthus.  His exam is consistent with conjunctivitis.  Given that his wife is also being treated for dermatitis I will start him on Vigamox 3 times daily x7 days.  He does have a history of bilateral lens replacement secondary to cataracts.  I advised him that if his symptoms worsen he needs to follow-up with his ophthalmologist.   Final Clinical Impressions(s) / UC Diagnoses   Final diagnoses:  Acute bacterial conjunctivitis of both eyes     Discharge Instructions      Instill 1 drop of Vigamox in each eye every 8 hours for the next 7 days for treatment of your conjunctivitis.  Avoid touching your eyes as much as possible.  Wipe down all surfaces, countertops, and doorknobs after the first and second 24 hours on eyedrops.  Wash her face with a clean wash rag to remove any drainage and use a different portion of the wash rag to clean each eye so as to not reinfect yourself.  Return for reevaluation for any new or worsening symptoms.      ED Prescriptions     Medication Sig Dispense Auth. Provider   moxifloxacin (VIGAMOX) 0.5 % ophthalmic solution Place 1 drop into both eyes 3 (three) times  daily for 7 days. 3 mL Becky Augusta, NP      PDMP not reviewed this encounter.   Becky Augusta, NP 05/30/21 1446

## 2021-09-04 ENCOUNTER — Encounter: Payer: Self-pay | Admitting: Emergency Medicine

## 2021-09-04 ENCOUNTER — Ambulatory Visit
Admission: EM | Admit: 2021-09-04 | Discharge: 2021-09-04 | Disposition: A | Discharge status: Home / Self Care | Payer: Medicare Other | Attending: Family Medicine | Admitting: Family Medicine

## 2021-09-04 DIAGNOSIS — H1033 Unspecified acute conjunctivitis, bilateral: Secondary | ICD-10-CM

## 2021-09-04 MED ORDER — POLYMYXIN B-TRIMETHOPRIM 10000-0.1 UNIT/ML-% OP SOLN: 2.0000 [drp] | OPHTHALMIC | 0 refills | Status: AC

## 2021-09-04 MED ORDER — CETIRIZINE HCL 10 MG PO TABS: 10.0000 mg | ORAL_TABLET | Freq: Every day | ORAL | 11 refills | Status: AC | PRN

## 2021-09-04 NOTE — Discharge Instructions (Addendum)
See handout on conjunctivitis.  Stop by the pharmacy to pick up your prescriptions.

## 2021-09-04 NOTE — ED Provider Notes (Signed)
[facility]    CSN: [id number] Arrival date & time: 09/04/21  1039      History   Chief Complaint Chief Complaint  Patient presents with   Eye Problem    HPI Curtis Clay is a 72 y.o. male.   HPI   Curtis Clay presents for bilateral itchy watery eyes. Started on the left eye and they went to the right eye.  Endorses redness and wiping when he discharge several times a day.  He does have some pets at home.  His wife is not having similar symptoms.  Denies sneezing, cough, runny nose, congestion, neck pain and headache.  He is eating and drinking normally.  Says he has otherwise been well.     Past Medical History:  Diagnosis Date   Coronary artery disease    Dyspnea    Hypertension    Myocardial infarction [facility])    Sleep apnea     Patient Active Problem List   Diagnosis Date Noted   ED (erectile dysfunction) 05/30/2021   DOE (dyspnea on exertion) 05/30/2021   CAD (coronary artery disease) 05/30/2021   Hyperplastic polyp of ascending colon 07/25/2019   Thrombocytopenia (HCC) 08/16/2018   OSA (obstructive sleep apnea) 10/25/2017   Obesity (BMI 30-39.9) 08/25/2017   CAD S/P percutaneous coronary angioplasty    Angina pectoris (HCC) 08/20/2016   History of non-ST elevation myocardial infarction (NSTEMI) 08/20/2016   Tests ordered 02/08/2014   Benign essential hypertension 08/19/2011   Mixed hyperlipidemia 08/19/2011    Past Surgical History:  Procedure Laterality Date   CARDIAC SURGERY     CIRCUMCISION, NON-NEWBORN     COLONOSCOPY WITH PROPOFOL N/A 07/25/2019   Procedure: COLONOSCOPY WITH PROPOFOL;  Surgeon: Toledo, Boykin Nearing, MD;  Location: [facility] ENDOSCOPY;  Service: Gastroenterology;  Laterality: N/A;   CORONARY STENT INTERVENTION N/A 08/20/2016   Procedure: CORONARY STENT INTERVENTION;  Surgeon: Alwyn Pea, MD;  Location: [facility] INVASIVE CV LAB;  Service: Cardiovascular;  Laterality: N/A;   LEFT HEART CATH AND CORONARY ANGIOGRAPHY N/A 08/20/2016    Procedure: LEFT HEART CATH AND CORONARY ANGIOGRAPHY;  Surgeon: Dalia Heading, MD;  Location: [facility] INVASIVE CV LAB;  Service: Cardiovascular;  Laterality: N/A;   LEFT HEART CATH AND CORONARY ANGIOGRAPHY Left 12/29/2018   Procedure: LEFT HEART CATH AND CORONARY ANGIOGRAPHY;  Surgeon: Alwyn Pea, MD;  Location: [facility] INV[facility] CV LAB;  Service: Cardiovascular;  Laterality: Left;   TONSILLECTOMY         Home Medications    Prior to Admission medications   Medication Sig Start Date End Date Taking? Authorizing Provider  cetirizine (ZYRTEC) 10 MG tablet Take 1 tablet (10 mg total) by mouth daily as needed for allergies. 09/04/21  Yes Neila Teem, Seward Meth, DO  trimethoprim-polymyxin b (POLYTRIM) ophthalmic solution Place 2 drops into both eyes every 4 (four) hours for 5 days. 09/07/21 09/12/21 Yes Daissy Yerian, DO  albuterol (VENTOLIN HFA) 108 (90 Base) MCG/ACT inhaler Inhale into the lungs. 03/26/21 03/26/22  [provider]  clopidogrel (PLAVIX) 75 MG tablet Take 1 tablet (75 mg total) by mouth daily with breakfast. 08/22/16   Altamese Dilling, MD  enalapril (VASOTEC) 10 MG tablet 1 tablet (10 mg total) once daily 02/06/21   [provider]  isosorbide mononitrate (IMDUR) 60 MG 24 hr tablet Take 60 mg by mouth daily. 04/15/21   [provider]  metoprolol tartrate (LOPRESSOR) 25 MG tablet Take 25 mg by mouth daily.    [provider]  rosuvastatin (CRESTOR) 20  MG tablet Take 20 mg by mouth at bedtime. 12/21/18   [provider]  sildenafil (VIAGRA) 50 MG tablet Take by mouth. 07/16/20   [provider]    Family History Family History  Problem Relation Age of Onset   Vascular Disease Mother     Social History Social History   Tobacco Use   Smoking status: Never   Smokeless tobacco: Never  Vaping Use   Vaping Use: Never used  Substance Use Topics   Alcohol use: No   Drug use: No     Allergies   Patient has no known  allergies.   Review of Systems Review of Systems: :negative unless otherwise stated in HPI.      Physical Exam Triage Vital Signs ED Triage Vitals  Enc Vitals Group     BP 09/04/21 1121 125/71     Pulse Rate 09/04/21 1121 (!) 50     Resp 09/04/21 1121 15     Temp 09/04/21 1121 98 F (36.7 C)     Temp Source 09/04/21 1121 Oral     SpO2 09/04/21 1121 96 %     Weight 09/04/21 1120 250 lb (113.4 kg)     Height 09/04/21 1120 5\' 11"  (1.803 m)     Head Circumference --      Peak Flow --      Pain Score 09/04/21 1120 0     Pain Loc --      Pain Edu? --      Excl. in GC? --    No data found.  Updated Vital Signs BP 125/71 (BP Location: Left Arm)   Pulse (!) 50   Temp 98 F (36.7 C) (Oral)   Resp 15   Ht 5\' 11"  (1.803 m)   Wt 113.4 kg   SpO2 96%   BMI 34.87 kg/m   Visual Acuity Right Eye Distance: 20/40 corrected Left Eye Distance: 20/30 corrected Bilateral Distance: 20/20 corrected  Right Eye Near:   Left Eye Near:    Bilateral Near:     Physical Exam GEN:     alert, non-toxic appearing male in no distress    HENT:  mucus membranes moist, no nasal discharge,  EYES:   pupils equal and reactive, bilateral scleral injection, mucoid discharge in the canthus, tearing present, extraocular motions intact NECK:  normal ROM RESP:  no increased work of breathing CVS:   regular rate Skin:   warm and dry    UC Treatments / Results  Labs (all labs ordered are listed, but only abnormal results are displayed) Labs Reviewed - No data to display  EKG   Radiology No results found.  Procedures Procedures (including critical care time)  Medications Ordered in UC Medications - No data to display  Initial Impression / Assessment and Plan / UC Course  I have reviewed the triage vital signs and the nursing notes.  Pertinent labs & imaging results that were available during my care of the patient were reviewed by me and considered in my medical decision making (see  chart for details).     Patient is a 71 year old male who presents for bilateral eye redness.  Overall pt is well appearing, well hydrated, without respiratory distress.  He is afebrile.  Exam is remarkable for scleral injection and discharge bilaterally.  He does not have any other respiratory symptoms to make me think this is viral.  With the itching and watering I suspect that this is allergic.  Patient is going to  be around his grandchildren soon and does not want to give them anything.  It is reasonable to give him Polytrim drops to be used if symptoms do not clear up with Zyrtec in the next 3 days.  Discussed MDM, treatment plan and plan for follow-up with patient/parent who agrees with plan.        Final Clinical Impressions(s) / UC Diagnoses   Final diagnoses:  Acute conjunctivitis of both eyes, unspecified acute conjunctivitis type     Discharge Instructions      See handout on conjunctivitis.  Stop by the pharmacy to pick up your prescriptions.     ED Prescriptions     Medication Sig Dispense Auth. Provider   cetirizine (ZYRTEC) 10 MG tablet Take 1 tablet (10 mg total) by mouth daily as needed for allergies. 30 tablet Ifeoma Vallin, DO   trimethoprim-polymyxin b (POLYTRIM) ophthalmic solution Place 2 drops into both eyes every 4 (four) hours for 5 days. 10 mL Katha Cabal, DO      PDMP not reviewed this encounter.   Katha Cabal, DO 09/04/21 1836

## 2021-09-04 NOTE — ED Triage Notes (Signed)
Patient c/o itchy watery bilateral eyes that started on Monday.  Patient denies fevers.

## 2021-09-25 ENCOUNTER — Other Ambulatory Visit: Payer: Self-pay | Admitting: Pulmonary Disease

## 2021-09-25 DIAGNOSIS — R0609 Other forms of dyspnea: Secondary | ICD-10-CM

## 2021-09-25 DIAGNOSIS — J849 Interstitial pulmonary disease, unspecified: Secondary | ICD-10-CM

## 2021-09-25 DIAGNOSIS — J8410 Pulmonary fibrosis, unspecified: Secondary | ICD-10-CM

## 2021-10-13 ENCOUNTER — Ambulatory Visit
Admission: RE | Admit: 2021-10-13 | Discharge: 2021-10-13 | Disposition: A | Discharge status: Home / Self Care | Payer: Medicare Other | Source: Ambulatory Visit | Attending: Pulmonary Disease | Admitting: Pulmonary Disease

## 2021-10-13 DIAGNOSIS — J8410 Pulmonary fibrosis, unspecified: Secondary | ICD-10-CM | POA: Diagnosis present

## 2021-10-13 DIAGNOSIS — J849 Interstitial pulmonary disease, unspecified: Secondary | ICD-10-CM | POA: Insufficient documentation

## 2021-10-13 DIAGNOSIS — R0609 Other forms of dyspnea: Secondary | ICD-10-CM | POA: Diagnosis present

## 2022-04-13 ENCOUNTER — Other Ambulatory Visit (INDEPENDENT_AMBULATORY_CARE_PROVIDER_SITE_OTHER): Payer: Self-pay | Admitting: Nurse Practitioner

## 2022-04-13 DIAGNOSIS — I739 Peripheral vascular disease, unspecified: Secondary | ICD-10-CM

## 2022-04-15 ENCOUNTER — Ambulatory Visit (INDEPENDENT_AMBULATORY_CARE_PROVIDER_SITE_OTHER): Payer: Medicare Other

## 2022-04-15 ENCOUNTER — Encounter (INDEPENDENT_AMBULATORY_CARE_PROVIDER_SITE_OTHER): Payer: Self-pay | Admitting: Nurse Practitioner

## 2022-04-15 ENCOUNTER — Ambulatory Visit (INDEPENDENT_AMBULATORY_CARE_PROVIDER_SITE_OTHER): Payer: Medicare Other | Admitting: Nurse Practitioner

## 2022-04-15 VITALS — BP 121/69 | HR 67 | Resp 18 | Ht 70.0 in | Wt 249.1 lb

## 2022-04-15 DIAGNOSIS — I739 Peripheral vascular disease, unspecified: Secondary | ICD-10-CM | POA: Diagnosis not present

## 2022-04-15 DIAGNOSIS — I10 Essential (primary) hypertension: Secondary | ICD-10-CM

## 2022-04-15 DIAGNOSIS — E782 Mixed hyperlipidemia: Secondary | ICD-10-CM | POA: Diagnosis not present

## 2022-04-15 LAB — VAS US ABI WITH/WO TBI
Left ABI: 1.03
Right ABI: 1.08

## 2022-05-09 ENCOUNTER — Encounter (INDEPENDENT_AMBULATORY_CARE_PROVIDER_SITE_OTHER): Payer: Self-pay | Admitting: Nurse Practitioner

## 2022-05-09 NOTE — Progress Notes (Signed)
Subjective:    Patient ID: Curtis Clay, male    DOB: 03-13-1950, 72 y.o.   MRN: [id number] Chief Complaint  Patient presents with   New Patient (Initial Visit)    NP. behling. ABI/consult. LLE claudication     The patient is seen for evaluation of painful lower extremities and diminished pulses. Patient notes the pain is always associated with activity and is very consistent day today. Typically, the pain occurs at less than one block, progress is as activity continues to the point that the patient must stop walking. Resting including standing still for several minutes allows the patient to walk a similar distance before being forced to stop again. Uneven terrain and inclines shorten the distance. The pain has been progressive over the past several months. The patient denies any abrupt changes in claudication symptoms.  The patient states the inability to walk is causing problems with daily activities.  The patient denies rest pain or dangling of an extremity off the side of the bed during the night for relief. No open wounds or sores at this time. No prior interventions or surgeries.  No history of back problems or DJD of the lumbar sacral spine.   The patient's blood pressure has been stable and relatively well controlled. The patient denies amaurosis fugax or recent TIA symptoms. There are no recent neurological changes noted. The patient denies history of DVT, PE or superficial thrombophlebitis. The patient denies recent episodes of angina or shortness of breath.   Today noninvasive studies show an ABI of 1.08 on the right and 1.03 on the left.  There is a TBI 0.79 on the right and 0.57 on the left.  The right has strong triphasic tibial artery waveforms with normal toe waveforms.  The left has biphasic/monophasic tibial waveforms with slightly dampened toe waveforms.    Review of Systems  Cardiovascular:        Claudication  All other systems reviewed and are negative.       Objective:   Physical Exam Vitals reviewed.  HENT:     Head: Normocephalic.  Cardiovascular:     Rate and Rhythm: Normal rate.     Pulses:          Dorsalis pedis pulses are detected w/ Doppler on the right side and detected w/ Doppler on the left side.       Posterior tibial pulses are detected w/ Doppler on the right side and detected w/ Doppler on the left side.  Pulmonary:     Effort: Pulmonary effort is normal.  Skin:    General: Skin is warm and dry.  Neurological:     Mental Status: He is alert and oriented to person, place, and time.  Psychiatric:        Mood and Affect: Mood normal.        Behavior: Behavior normal.        Thought Content: Thought content normal.        Judgment: Judgment normal.     BP 121/69 (BP Location: Left Arm)   Pulse 67   Resp 18   Ht 5\' 10"  (1.778 m)   Wt 249 lb 1.6 oz (113 kg)   BMI 35.74 kg/m   Past Medical History:  Diagnosis Date   Coronary artery disease    Dyspnea    Hypertension    Myocardial infarction [facility])    Sleep apnea     Social History   Socioeconomic History   Marital status: Married  Spouse name: Not on file   Number of children: Not on file   Years of education: Not on file   Highest education level: Not on file  Occupational History   Not on file  Tobacco Use   Smoking status: Never   Smokeless tobacco: Never  Vaping Use   Vaping Use: Never used  Substance and Sexual Activity   Alcohol use: No   Drug use: No   Sexual activity: Not on file  Other Topics Concern   Not on file  Social History Narrative   Not on file   Social Determinants of Health   Financial Resource Strain: Not on file  Food Insecurity: Not on file  Transportation Needs: Not on file  Physical Activity: Not on file  Stress: Not on file  Social Connections: Not on file  Intimate Partner Violence: Not on file    Past Surgical History:  Procedure Laterality Date   CARDIAC SURGERY     CIRCUMCISION, NON-NEWBORN      COLONOSCOPY WITH PROPOFOL N/A 07/25/2019   Procedure: COLONOSCOPY WITH PROPOFOL;  Surgeon: Toledo, Boykin Nearing, MD;  Location: [facility] ENDOSCOPY;  Service: Gastroenterology;  Laterality: N/A;   CORONARY STENT INTERVENTION N/A 08/20/2016   Procedure: CORONARY STENT INTERVENTION;  Surgeon: Alwyn Pea, MD;  Location: [facility] INVASIVE CV LAB;  Service: Cardiovascular;  Laterality: N/A;   LEFT HEART CATH AND CORONARY ANGIOGRAPHY N/A 08/20/2016   Procedure: LEFT HEART CATH AND CORONARY ANGIOGRAPHY;  Surgeon: Dalia Heading, MD;  Location: [facility] INVASIVE CV LAB;  Service: Cardiovascular;  Laterality: N/A;   LEFT HEART CATH AND CORONARY ANGIOGRAPHY Left 12/29/2018   Procedure: LEFT HEART CATH AND CORONARY ANGIOGRAPHY;  Surgeon: Alwyn Pea, MD;  Location: [facility] INVASIVE CV LAB;  Service: Cardiovascular;  Laterality: Left;   TONSILLECTOMY      Family History  Problem Relation Age of Onset   Vascular Disease Mother     No Known Allergies     Latest Ref Rng & Units 08/21/2016   12:01 AM 08/20/2016    7:11 AM 09/08/2012    5:16 AM  CBC  WBC 3.8 - 10.6 K/uL 9.1  7.7  7.1   Hemoglobin 13.0 - 18.0 g/dL 16.1  09.6  04.5   Hematocrit 40.0 - 52.0 % 44.4  46.5  42.1   Platelets 150 - 440 K/uL 127  141  125       CMP     Component Value Date/Time   NA 138 08/21/2016 0001   NA 136 09/08/2012 0516   K 3.9 08/21/2016 0001   K 4.2 09/08/2012 0516   CL 106 08/21/2016 0001   CL 104 09/08/2012 0516   CO2 25 08/21/2016 0001   CO2 27 09/08/2012 0516   GLUCOSE 104 (H) 08/21/2016 0001   GLUCOSE 82 09/08/2012 0516   BUN 12 08/21/2016 0001   BUN 15 09/08/2012 0516   CREATININE 0.94 08/21/2016 0001   CREATININE 1.03 09/08/2012 0516   CALCIUM 8.9 08/21/2016 0001   CALCIUM 8.9 09/08/2012 0516   PROT 6.6 09/06/2012 1741   ALBUMIN 3.4 09/06/2012 1741   AST 28 09/06/2012 1741   ALT 38 09/06/2012 1741   ALKPHOS 82 09/06/2012 1741   BILITOT 0.3 09/06/2012 1741   GFRNONAA >60 08/21/2016 0001    GFRNONAA >60 09/08/2012 0516   GFRAA >60 08/21/2016 0001   GFRAA >60 09/08/2012 0516     VAS [place] ABI WITH/WO TBI  Result Date: 04/15/2022  LOWER EXTREMITY DOPPLER STUDY Patient  Name:  Curtis Clay  Date of Exam:   04/15/2022 Medical Rec #: [id number]       Accession #:    [id number] Date of Birth: 1950-07-25       Patient Gender: M Patient Age:   89 years Exam Location:  [facility] Procedure:      VAS [place] ABI WITH/WO TBI Referring Phys: Sheppard Plumber --------------------------------------------------------------------------------  Indications: Claudication, and peripheral artery disease. High Risk Factors: Hyperlipidemia, prior MI, coronary artery disease.  Performing Technologist: Hardie Lora RVT  Examination Guidelines: A complete evaluation includes at minimum, Doppler waveform signals and systolic blood pressure reading at the level of bilateral brachial, anterior tibial, and posterior tibial arteries, when vessel segments are accessible. Bilateral testing is considered an integral part of a complete examination. Photoelectric Plethysmograph (PPG) waveforms and toe systolic pressure readings are included as required and additional duplex testing as needed. Limited examinations for reoccurring indications may be performed as noted.  ABI Findings: +---------+------------------+-----+---------+--------+ Right    Rt Pressure (mmHg)IndexWaveform Comment  +---------+------------------+-----+---------+--------+ Brachial 107                                      +---------+------------------+-----+---------+--------+ PTA      124               1.08 triphasic         +---------+------------------+-----+---------+--------+ DP       101               0.88 triphasic         +---------+------------------+-----+---------+--------+ Great Toe91                0.79                   +---------+------------------+-----+---------+--------+  +---------+------------------+-----+----------+-------+ Left     Lt Pressure (mmHg)IndexWaveform  Comment +---------+------------------+-----+----------+-------+ Brachial 115                                      +---------+------------------+-----+----------+-------+ PTA      118               1.03 biphasic          +---------+------------------+-----+----------+-------+ DP       93                0.81 monophasic        +---------+------------------+-----+----------+-------+ Great Toe66                0.57                   +---------+------------------+-----+----------+-------+ +-------+-----------+-----------+------------+------------+ ABI/TBIToday's ABIToday's TBIPrevious ABIPrevious TBI +-------+-----------+-----------+------------+------------+ Right  1.08       0.79                                +-------+-----------+-----------+------------+------------+ Left   1.03       0.57                                +-------+-----------+-----------+------------+------------+  Summary: Right: Resting right ankle-brachial index is within normal range. The right toe-brachial index is normal. Left: Resting left ankle-brachial index is within normal range. The left toe-brachial index  is abnormal. *See table(s) above for measurements and observations.  Electronically signed by Levora Dredge MD on 04/15/2022 at 4:46:12 PM.    Final        Assessment & Plan:   1. PAD (peripheral artery disease) (HCC)  Recommend:  The patient has evidence of atherosclerosis of the lower extremities with claudication.  The patient does not voice lifestyle limiting changes at this point in time.  No invasive studies, angiography or surgery at this time The patient should continue walking and begin a more formal exercise program.  The patient should continue antiplatelet therapy and aggressive treatment of the lipid abnormalities  No changes in the patient's medications at this  time  Continued surveillance is indicated as atherosclerosis is likely to progress with time.    The patient will continue follow up with noninvasive studies as ordered.  The patient will follow-up in 6 months  2. Mixed hyperlipidemia Continue statin as ordered and reviewed, no changes at this time  3. Benign essential hypertension Continue antihypertensive medications as already ordered, these medications have been reviewed and there are no changes at this time.   Current Outpatient Medications on File Prior to Visit  Medication Sig Dispense Refill   cetirizine (ZYRTEC) 10 MG tablet Take 1 tablet (10 mg total) by mouth daily as needed for allergies. 30 tablet 11   clopidogrel (PLAVIX) 75 MG tablet Take 1 tablet (75 mg total) by mouth daily with breakfast. 30 tablet 1   enalapril (VASOTEC) 10 MG tablet 1 tablet (10 mg total) once daily     isosorbide mononitrate (IMDUR) 60 MG 24 hr tablet Take 60 mg by mouth daily.     metoprolol tartrate (LOPRESSOR) 25 MG tablet Take 25 mg by mouth daily.     rosuvastatin (CRESTOR) 20 MG tablet Take 20 mg by mouth at bedtime.     sildenafil (VIAGRA) 50 MG tablet Take by mouth.     albuterol (VENTOLIN HFA) 108 (90 Base) MCG/ACT inhaler Inhale into the lungs.     No current facility-administered medications on file prior to visit.    There are no Patient Instructions on file for this visit. No follow-ups on file.   Georgiana Spinner, NP

## 2022-10-06 ENCOUNTER — Other Ambulatory Visit: Payer: Self-pay | Admitting: Pulmonary Disease

## 2022-10-06 DIAGNOSIS — J849 Interstitial pulmonary disease, unspecified: Secondary | ICD-10-CM

## 2022-10-12 ENCOUNTER — Ambulatory Visit
Admission: RE | Admit: 2022-10-12 | Discharge: 2022-10-12 | Disposition: A | Discharge status: Home / Self Care | Payer: Medicare Other | Source: Ambulatory Visit | Attending: Pulmonary Disease | Admitting: Pulmonary Disease

## 2022-10-12 DIAGNOSIS — J849 Interstitial pulmonary disease, unspecified: Secondary | ICD-10-CM | POA: Diagnosis present

## 2022-10-20 ENCOUNTER — Other Ambulatory Visit (INDEPENDENT_AMBULATORY_CARE_PROVIDER_SITE_OTHER): Payer: Self-pay | Admitting: Nurse Practitioner

## 2022-10-20 DIAGNOSIS — I739 Peripheral vascular disease, unspecified: Secondary | ICD-10-CM

## 2022-10-21 ENCOUNTER — Other Ambulatory Visit (INDEPENDENT_AMBULATORY_CARE_PROVIDER_SITE_OTHER): Payer: Self-pay | Admitting: Nurse Practitioner

## 2022-10-21 DIAGNOSIS — I739 Peripheral vascular disease, unspecified: Secondary | ICD-10-CM

## 2022-10-25 ENCOUNTER — Ambulatory Visit (INDEPENDENT_AMBULATORY_CARE_PROVIDER_SITE_OTHER): Payer: Medicare Other

## 2022-10-25 ENCOUNTER — Ambulatory Visit (INDEPENDENT_AMBULATORY_CARE_PROVIDER_SITE_OTHER): Payer: Medicare Other | Admitting: Vascular Surgery

## 2022-10-25 ENCOUNTER — Encounter (INDEPENDENT_AMBULATORY_CARE_PROVIDER_SITE_OTHER): Payer: Self-pay | Admitting: Vascular Surgery

## 2022-10-25 VITALS — BP 110/65 | HR 53 | Resp 18 | Ht 70.0 in | Wt 252.8 lb

## 2022-10-25 DIAGNOSIS — I739 Peripheral vascular disease, unspecified: Secondary | ICD-10-CM

## 2022-10-25 DIAGNOSIS — J449 Chronic obstructive pulmonary disease, unspecified: Secondary | ICD-10-CM

## 2022-10-25 DIAGNOSIS — I70213 Atherosclerosis of native arteries of extremities with intermittent claudication, bilateral legs: Secondary | ICD-10-CM | POA: Diagnosis not present

## 2022-10-25 DIAGNOSIS — I70219 Atherosclerosis of native arteries of extremities with intermittent claudication, unspecified extremity: Secondary | ICD-10-CM | POA: Insufficient documentation

## 2022-10-25 DIAGNOSIS — Z9861 Coronary angioplasty status: Secondary | ICD-10-CM

## 2022-10-25 DIAGNOSIS — E782 Mixed hyperlipidemia: Secondary | ICD-10-CM | POA: Diagnosis not present

## 2022-10-25 DIAGNOSIS — I10 Essential (primary) hypertension: Secondary | ICD-10-CM | POA: Diagnosis not present

## 2022-10-25 DIAGNOSIS — I2510 Atherosclerotic heart disease of native coronary artery without angina pectoris: Secondary | ICD-10-CM

## 2022-10-25 NOTE — Progress Notes (Signed)
MRN : [id number]  Curtis Clay is a 72 y.o. (1950/03/01) male who presents with chief complaint of check circulation.  History of Present Illness:  The patient returns to the office for followup regarding atherosclerotic changes of the lower extremities and review of the noninvasive studies.   There have been no interval changes in lower extremity symptoms. No interval shortening of the patient's claudication distance or development of rest pain symptoms. No new ulcers or wounds have occurred since the last visit.  There have been no significant changes to the patient's overall health care.  The patient denies amaurosis fugax or recent TIA symptoms. There are no documented recent neurological changes noted. There is no history of DVT, PE or superficial thrombophlebitis. The patient denies recent episodes of angina or shortness of breath.   ABI Rt=1.04 and Lt=0.98  (previous ABI's Rt=1.08 and Lt=1.03) Duplex ultrasound of the left lower extremity arterial system demonstrates mild atherosclerotic changes no hemodynamically significant stenosis is noted  No outpatient medications have been marked as taking for the 10/25/22 encounter (Appointment) with Gilda Crease, Latina Craver, MD.    Past Medical History:  Diagnosis Date   Coronary artery disease    Dyspnea    Hypertension    Myocardial infarction [facility])    Sleep apnea     Past Surgical History:  Procedure Laterality Date   CARDIAC SURGERY     CIRCUMCISION, NON-NEWBORN     COLONOSCOPY WITH PROPOFOL N/A 07/25/2019   Procedure: COLONOSCOPY WITH PROPOFOL;  Surgeon: Toledo, Boykin Nearing, MD;  Location: [facility] ENDOSCOPY;  Service: Gastroenterology;  Laterality: N/A;   CORONARY STENT INTERVENTION N/A 08/20/2016   Procedure: CORONARY STENT INTERVENTION;  Surgeon: Alwyn Pea, MD;  Location: [facility] INVASIVE CV LAB;  Service: Cardiovascular;  Laterality: N/A;   LEFT HEART  CATH AND CORONARY ANGIOGRAPHY N/A 08/20/2016   Procedure: LEFT HEART CATH AND CORONARY ANGIOGRAPHY;  Surgeon: Dalia Heading, MD;  Location: [facility] INVASIVE CV LAB;  Service: Cardiovascular;  Laterality: N/A;   LEFT HEART CATH AND CORONARY ANGIOGRAPHY Left 12/29/2018   Procedure: LEFT HEART CATH AND CORONARY ANGIOGRAPHY;  Surgeon: Alwyn Pea, MD;  Location: [facility] INVASIVE CV LAB;  Service: Cardiovascular;  Laterality: Left;   TONSILLECTOMY      Social History Social History   Tobacco Use   Smoking status: Never   Smokeless tobacco: Never  Vaping Use   Vaping status: Never Used  Substance Use Topics   Alcohol use: No   Drug use: No    Family History Family History  Problem Relation Age of Onset   Vascular Disease Mother     No Known Allergies   REVIEW OF SYSTEMS (Negative unless checked)  Constitutional: [] Weight loss  [] Fever  [] Chills Cardiac: [] Chest pain   [] Chest pressure   [] Palpitations   [] Shortness of breath when laying flat   [] Shortness of breath with exertion. Vascular:  [x] Pain in legs with walking   [] Pain in legs at rest  [] History of DVT   [] Phlebitis   [] Swelling in legs   [] Varicose veins   [] Non-healing ulcers Pulmonary:   [] Uses  home oxygen   [] Productive cough   [] Hemoptysis   [] Wheeze  [] COPD   [] Asthma Neurologic:  [] Dizziness   [] Seizures   [] History of stroke   [] History of TIA  [] Aphasia   [] Vissual changes   [] Weakness or numbness in arm   [] Weakness or numbness in leg Musculoskeletal:   [] Joint swelling   [] Joint pain   [] Low back pain Hematologic:  [] Easy bruising  [] Easy bleeding   [] Hypercoagulable state   [] Anemic Gastrointestinal:  [] Diarrhea   [] Vomiting  [] Gastroesophageal reflux/heartburn   [] Difficulty swallowing. Genitourinary:  [] Chronic kidney disease   [] Difficult urination  [] Frequent urination   [] Blood in urine Skin:  [] Rashes   [] Ulcers  Psychological:  [] History of anxiety   []  History of major depression.  Physical  Examination  There were no vitals filed for this visit. There is no height or weight on file to calculate BMI. Gen: WD/WN, NAD Head: [place]/AT, No temporalis wasting.  Ear/Nose/Throat: Hearing grossly intact, nares w/o erythema or drainage Eyes: PER, EOMI, sclera nonicteric.  Neck: Supple, no masses.  No bruit or JVD.  Pulmonary:  Good air movement, no audible wheezing, no use of accessory muscles.  Cardiac: RRR, normal S1, S2, no Murmurs. Vascular:  mild trophic changes, no open wounds Vessel Right Left  Radial Palpable Palpable  PT Palpable Palpable  DP Palpable  Palpable  Gastrointestinal: soft, non-distended. No guarding/no peritoneal signs.  Musculoskeletal: M/S 5/5 throughout.  No visible deformity.  Neurologic: CN 2-12 intact. Pain and light touch intact in extremities.  Symmetrical.  Speech is fluent. Motor exam as listed above. Psychiatric: Judgment intact, Mood & affect appropriate for pt's clinical situation. Dermatologic: No rashes or ulcers noted.  No changes consistent with cellulitis.   CBC Lab Results  Component Value Date   WBC 9.1 08/21/2016   HGB 15.4 08/21/2016   HCT 44.4 08/21/2016   MCV 91.7 08/21/2016   PLT 127 (L) 08/21/2016    BMET    Component Value Date/Time   NA 138 08/21/2016 0001   NA 136 09/08/2012 0516   K 3.9 08/21/2016 0001   K 4.2 09/08/2012 0516   CL 106 08/21/2016 0001   CL 104 09/08/2012 0516   CO2 25 08/21/2016 0001   CO2 27 09/08/2012 0516   GLUCOSE 104 (H) 08/21/2016 0001   GLUCOSE 82 09/08/2012 0516   BUN 12 08/21/2016 0001   BUN 15 09/08/2012 0516   CREATININE 0.94 08/21/2016 0001   CREATININE 1.03 09/08/2012 0516   CALCIUM 8.9 08/21/2016 0001   CALCIUM 8.9 09/08/2012 0516   GFRNONAA >60 08/21/2016 0001   GFRNONAA >60 09/08/2012 0516   GFRAA >60 08/21/2016 0001   GFRAA >60 09/08/2012 0516   CrCl cannot be calculated (Patient's most recent lab result is older than the maximum 21 days allowed.).  COAG Lab Results   Component Value Date   INR 1.00 08/20/2016    Radiology No results found.   Assessment/Plan 1. Atherosclerosis of native artery of both lower extremities with intermittent claudication (HCC) Recommend:  I do not find evidence of life style limiting vascular disease. The patient specifically denies life style limitation.  Previous noninvasive studies including ABI's of the legs do not identify critical vascular problems.  The patient should continue walking and begin a more formal exercise program. The patient should continue his antiplatelet therapy and aggressive treatment of the lipid abnormalities.  The patient is instructed to call the office if there is a significant change in the lower extremity symptoms,  particularly if a wound develops or there is an abrupt increase in leg pain.  Patient will follow-up with me on a PRN basis  2. CAD S/P percutaneous coronary angioplasty Continue cardiac and antihypertensive medications as already ordered and reviewed, no changes at this time.  Continue statin as ordered and reviewed, no changes at this time  Nitrates PRN for chest pain  3. Benign essential hypertension Continue antihypertensive medications as already ordered, these medications have been reviewed and there are no changes at this time.  4. Mixed hyperlipidemia Continue statin as ordered and reviewed, no changes at this time  5. Chronic obstructive pulmonary disease, unspecified COPD type (HCC) Continue pulmonary medications and aerosols as already ordered, these medications have been reviewed and there are no changes at this time.     Levora Dredge, MD  10/25/2022 12:58 PM

## 2022-10-28 LAB — VAS US ABI WITH/WO TBI
Left ABI: 0.98
Right ABI: 1.04

## 2022-10-31 ENCOUNTER — Other Ambulatory Visit: Payer: Self-pay | Admitting: Pulmonary Disease

## 2022-10-31 ENCOUNTER — Encounter (INDEPENDENT_AMBULATORY_CARE_PROVIDER_SITE_OTHER): Payer: Self-pay | Admitting: Vascular Surgery

## 2022-10-31 DIAGNOSIS — J849 Interstitial pulmonary disease, unspecified: Secondary | ICD-10-CM

## 2022-11-26 ENCOUNTER — Telehealth (INDEPENDENT_AMBULATORY_CARE_PROVIDER_SITE_OTHER): Payer: Self-pay

## 2022-11-26 NOTE — Telephone Encounter (Signed)
I left a detailed message on the patient voicemail with provider medical recommendations. Also I will have the receptionist contact the patient to schedule appointment.

## 2022-11-26 NOTE — Telephone Encounter (Signed)
Patient left a message that he is having severe pain with left leg in the thigh and calf area. Patient was last seen on 10/25/2022. Patient was advise to contact the office in the progress note.Please Advise

## 2022-11-26 NOTE — Telephone Encounter (Signed)
He can come in with ABIs however if his pain is severe and he cannot wait for an appointment he should visit the ED

## 2022-11-29 NOTE — Telephone Encounter (Signed)
Spoke with pt - I offered an ABI ultrasound appt for tomorrow and per patient he can't come. Next available was scheduled with pt for Friday 11.22.24

## 2022-11-30 ENCOUNTER — Other Ambulatory Visit (INDEPENDENT_AMBULATORY_CARE_PROVIDER_SITE_OTHER): Payer: Self-pay | Admitting: Nurse Practitioner

## 2022-11-30 DIAGNOSIS — M79605 Pain in left leg: Secondary | ICD-10-CM

## 2022-12-03 ENCOUNTER — Ambulatory Visit (INDEPENDENT_AMBULATORY_CARE_PROVIDER_SITE_OTHER): Payer: Medicare Other

## 2022-12-03 DIAGNOSIS — M79605 Pain in left leg: Secondary | ICD-10-CM | POA: Diagnosis not present

## 2022-12-07 LAB — VAS US ABI WITH/WO TBI
Left ABI: 0.92
Right ABI: 1.02

## 2023-05-31 ENCOUNTER — Encounter (INDEPENDENT_AMBULATORY_CARE_PROVIDER_SITE_OTHER): Payer: Self-pay
